# Patient Record
Sex: Female | Born: 1995
Health system: Southern US, Community
[De-identification: ages and names within clinical notes are randomized; demographics above are authoritative.]

## PROBLEM LIST (undated history)

## (undated) DIAGNOSIS — R946 Abnormal results of thyroid function studies: Secondary | ICD-10-CM

## (undated) DIAGNOSIS — S060X9A Concussion with loss of consciousness of unspecified duration, initial encounter: Secondary | ICD-10-CM

## (undated) DIAGNOSIS — R7989 Other specified abnormal findings of blood chemistry: Secondary | ICD-10-CM

## (undated) DIAGNOSIS — L52 Erythema nodosum: Secondary | ICD-10-CM

## (undated) DIAGNOSIS — E669 Obesity, unspecified: Secondary | ICD-10-CM

## (undated) DIAGNOSIS — S060XAA Concussion with loss of consciousness status unknown, initial encounter: Secondary | ICD-10-CM

## (undated) DIAGNOSIS — E049 Nontoxic goiter, unspecified: Secondary | ICD-10-CM

## (undated) DIAGNOSIS — I10 Essential (primary) hypertension: Secondary | ICD-10-CM

## (undated) DIAGNOSIS — E739 Lactose intolerance, unspecified: Secondary | ICD-10-CM

## (undated) DIAGNOSIS — E119 Type 2 diabetes mellitus without complications: Secondary | ICD-10-CM

## (undated) DIAGNOSIS — E782 Mixed hyperlipidemia: Secondary | ICD-10-CM

## (undated) DIAGNOSIS — J309 Allergic rhinitis, unspecified: Secondary | ICD-10-CM

## (undated) DIAGNOSIS — L309 Dermatitis, unspecified: Secondary | ICD-10-CM

## (undated) DIAGNOSIS — R945 Abnormal results of liver function studies: Secondary | ICD-10-CM

## (undated) DIAGNOSIS — L83 Acanthosis nigricans: Secondary | ICD-10-CM

## (undated) DIAGNOSIS — K219 Gastro-esophageal reflux disease without esophagitis: Secondary | ICD-10-CM

## (undated) DIAGNOSIS — L4050 Arthropathic psoriasis, unspecified: Secondary | ICD-10-CM

## (undated) DIAGNOSIS — R1013 Epigastric pain: Secondary | ICD-10-CM

## (undated) DIAGNOSIS — D509 Iron deficiency anemia, unspecified: Secondary | ICD-10-CM

## (undated) DIAGNOSIS — R7303 Prediabetes: Secondary | ICD-10-CM

## (undated) HISTORY — DX: Erythema nodosum: L52

## (undated) HISTORY — DX: Acanthosis nigricans: L83

## (undated) HISTORY — PX: TONSILLECTOMY AND ADENOIDECTOMY: SHX28

## (undated) HISTORY — DX: Other specified abnormal findings of blood chemistry: R79.89

## (undated) HISTORY — PX: WISDOM TOOTH EXTRACTION: SHX21

## (undated) HISTORY — DX: Essential (primary) hypertension: I10

## (undated) HISTORY — DX: Arthropathic psoriasis, unspecified: L40.50

## (undated) HISTORY — DX: Allergic rhinitis, unspecified: J30.9

## (undated) HISTORY — DX: Lactose intolerance, unspecified: E73.9

## (undated) HISTORY — PX: OTHER SURGICAL HISTORY: SHX169

## (undated) HISTORY — DX: Gastro-esophageal reflux disease without esophagitis: K21.9

## (undated) HISTORY — DX: Iron deficiency anemia, unspecified: D50.9

## (undated) HISTORY — DX: Type 2 diabetes mellitus without complications: E11.9

## (undated) HISTORY — DX: Dermatitis, unspecified: L30.9

## (undated) HISTORY — DX: Prediabetes: R73.03

## (undated) HISTORY — DX: Concussion with loss of consciousness of unspecified duration, initial encounter: S06.0X9A

## (undated) HISTORY — PX: ANKLE SURGERY: SHX546

## (undated) HISTORY — DX: Obesity, unspecified: E66.9

## (undated) HISTORY — DX: Mixed hyperlipidemia: E78.2

## (undated) HISTORY — DX: Epigastric pain: R10.13

## (undated) HISTORY — DX: Abnormal results of liver function studies: R94.5

## (undated) HISTORY — DX: Abnormal results of thyroid function studies: R94.6

## (undated) HISTORY — PX: TYMPANOSTOMY TUBE PLACEMENT: SHX32

## (undated) HISTORY — DX: Nontoxic goiter, unspecified: E04.9

## (undated) HISTORY — DX: Concussion with loss of consciousness status unknown, initial encounter: S06.0XAA

---
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
## 2014-01-05 ENCOUNTER — Encounter: Payer: Self-pay | Admitting: Obstetrics and Gynecology

## 2014-01-05 ENCOUNTER — Ambulatory Visit (INDEPENDENT_AMBULATORY_CARE_PROVIDER_SITE_OTHER): Payer: 59 | Admitting: Obstetrics and Gynecology

## 2014-01-05 VITALS — BP 110/70 | HR 80 | Resp 12 | Ht 67.0 in | Wt 234.6 lb

## 2014-01-05 DIAGNOSIS — Z01419 Encounter for gynecological examination (general) (routine) without abnormal findings: Secondary | ICD-10-CM

## 2014-01-05 DIAGNOSIS — Z0000 Encounter for general adult medical examination without abnormal findings: Secondary | ICD-10-CM

## 2014-01-05 LAB — HEMOGLOBIN, FINGERSTICK: HEMOGLOBIN, FINGERSTICK: 11.6 g/dL — AB (ref 12.0–16.0)

## 2014-01-05 LAB — POCT URINALYSIS DIPSTICK
UROBILINOGEN UA: NEGATIVE
pH, UA: 5

## 2014-01-05 NOTE — Progress Notes (Signed)
18 y.o. G0P0000 SingleCaucasianF here for annual exam.   Has menses last 10 - 12 days but this is better than what they were in the past.  Can be only 5 days.  No real cramping.  Does not check IUD strings.  Overall, cycles are improved.   Declines STD testing.   Finished first year of college.  [place] Learning Spanish.   Having a flare of her erythema nodosum for the last 8 - 9 months.  Seeing Rheumatologist who does not think it is related to her Skyla IUD.  Using methotrexate, tramadol, and Zantac.   Patient's last menstrual period was 01/05/2014.          Sexually active: No.  Never.  The current method of family planning is IUD.   Skyla IUD placed on 03/10/13. Exercising: Yes.    indoor soccer. Smoker:  no  Health Maintenance: Colonoscopy:  2014- Normal f/u in 5 years  TDaP:  Within 10 years Screening Labs: yes , Hb today: 11.6 , Urine today: Blood ++ (Menses), Trace Protein, Leuks ++ - Denies urgency or pain.    reports that she has never smoked. She has never used smokeless tobacco. She reports that she does not drink alcohol or use illicit drugs.  Past Medical History  Diagnosis Date  . Gastroesophageal reflux   . Obesity   . Acanthosis   . Abnormal thyroid function test   . Combined hyperlipidemia   . Dyspepsia   . Prediabetes   . Hypertension   . Abnormal liver function tests   . Asthma   . Diabetes mellitus without complication   . Erythema nodosum   . Iron deficiency anemia   . Allergic rhinitis   . Diabetes mellitus   . Eczema   . Goiter     hypothyroidsm--hashimoto's  . Lactose intolerance     Past Surgical History  Procedure Laterality Date  . Tympanostomy tube placement    . Tonsillectomy and adenoidectomy    . Sinuses    . Ankle surgery Right     --bone spur  . Wisdom tooth extraction      Current Outpatient Prescriptions  Medication Sig Dispense Refill  . acetaminophen (TYLENOL) 500 MG tablet Take  1,000 mg by mouth every 4 (four) hours as needed for fever (alternating with ibuprofen).    Marland Kitchen. albuterol (PROVENTIL) (2.5 MG/3ML) 0.083% nebulizer solution Take 2.5 mg by nebulization every 6 (six) hours as needed for wheezing.    . B Complex-C (B-COMPLEX WITH VITAMIN C) tablet Take 1 tablet by mouth 2 (two) times daily.    . bethanechol (URECHOLINE) 5 MG tablet TAKE 1 TABLET THREE TIMES A DAY 270 tablet 3  . calcium-vitamin D (CALCIUM 500/D) 500-200 MG-UNIT per tablet 1 tablet.    . cholecalciferol (VITAMIN D) 1000 UNITS tablet Take 1,000 Units by mouth daily.    . clobetasol ointment (TEMOVATE) 0.05 % Apply to body lesions twice daily for 2 wks, then daily as needed for 2 wks, then stop. Can repeat as needed for flares. Not to face.    . desonide (DESOWEN) 0.05 % cream Apply topically 2 times daily. As needed for dry skin    . EPIPEN 2-PAK 0.3 MG/0.3ML DEVI Inject 0.3 mg into the muscle once as needed (for severe allergic reation).     Marland Kitchen. esomeprazole (NEXIUM) 40 MG capsule Take 1 capsule (40 mg total) by mouth daily before breakfast. 90 capsule 3  . ezetimibe (ZETIA)  10 MG tablet 10 mg.    . ibuprofen (ADVIL,MOTRIN) 200 MG tablet Take 600 mg by mouth every 6 (six) hours as needed for pain or fever (alternating with tlyenol).     . Iron Polysacch Cmplx-B12-FA 150-0.025-1 MG CAPS 2 capsules.    . iron polysaccharides (NIFEREX) 150 MG capsule Take 150 mg by mouth 2 (two) times daily.    Marland Kitchen levalbuterol (XOPENEX HFA) 45 MCG/ACT inhaler Inhale 1-2 puffs into the lungs every 6 (six) hours as needed for wheezing or shortness of breath.    . levocetirizine (XYZAL) 5 MG tablet Take 5 mg by mouth every evening.     . metFORMIN (GLUCOPHAGE) 1000 MG tablet TAKE 1 TABLET TWICE A DAY WITH MEALS 180 tablet 1  . Multiple Vitamin (MULTI-VITAMINS) TABS 1 tablet.    . naproxen (NAPROSYN) 500 MG tablet Take one tablet by mouth twice daily with food.    Marland Kitchen NOVOFINE 32G X 6 MM MISC USE 1 NEEDLE DAILY 1 each 2  .  Olopatadine HCl (PATADAY) 0.2 % SOLN 1 drop.    Marland Kitchen SYNTHROID 125 MCG tablet TAKE 1 TABLET DAILY 90 tablet 1  . triamcinolone ointment (KENALOG) 0.1 % Apply to affected areas on the body daily to twice daily as needed for flares. Not to face.    Geoffry Paradise 150 MG injection Inject 150 mg into the skin every 14 (fourteen) days.     Marland Kitchen ZETIA 10 MG tablet TAKE 1 TABLET DAILY 90 tablet 5  . [DISCONTINUED] bethanechol (URECHOLINE) 5 MG tablet Take 1 tablet (5 mg total) by mouth 3 (three) times daily. 270 tablet 3   No current facility-administered medications for this visit.    Family History  Problem Relation Age of Onset  . Cholelithiasis Maternal Grandmother   . Diabetes Maternal Grandmother   . Cervical cancer Maternal Grandmother   . Stroke Maternal Grandmother   . Diabetes Mother   . Hyperlipidemia Father   . Hypertension Father   . Diabetes Father   . Colon cancer Father   . Cancer Maternal Grandfather   . Diabetes Maternal Grandfather   . Stroke Maternal Grandfather   . Thyroid disease Neg Hx   . Diabetes Paternal Grandmother   . Diabetes Paternal Grandfather     ROS:  Pertinent items are noted in HPI.  Otherwise, a comprehensive ROS was negative.  Exam:   BP 110/70 mmHg  Pulse 80  Resp 12  Ht 5\' 7"  (1.702 m)  Wt 234 lb 9.6 oz (106.414 kg)  BMI 36.73 kg/m2  LMP 01/05/2014     Height: 5\' 7"  (170.2 cm)  Ht Readings from Last 3 Encounters:  01/05/14 5\' 7"  (1.702 m) (86 %*, Z = 1.07)  04/21/13 5\' 6"  (1.676 m) (75 %*, Z = 0.69)  11/09/12 5' 6.3" (1.684 m) (79 %*, Z = 0.82)   * Growth percentiles are based on CDC 2-20 Years data.    General appearance: alert, cooperative and appears stated age Head: Normocephalic, without obvious abnormality, atraumatic Neck: no adenopathy, supple, symmetrical, trachea midline and thyroid normal to inspection and palpation Lungs: clear to auscultation bilaterally Breasts: normal appearance, no masses or tenderness, Inspection negative, No  nipple retraction or dimpling, No nipple discharge or bleeding, No axillary or supraclavicular adenopathy Heart: regular rate and rhythm Abdomen: soft, non-tender; bowel sounds normal; no masses,  no organomegaly Extremities: extremities with erythematous slightly tender raised 1 - 2 cm lumps. Skin: Skin color, texture, turgor normal. No rashes or lesions  Lymph nodes: Cervical, supraclavicular, and axillary nodes normal. No abnormal inguinal nodes palpated Neurologic: Grossly normal   Pelvic: External genitalia:  no lesions              Urethra:  normal appearing urethra with no masses, tenderness or lesions              Bartholins and Skenes: normal                 Vagina: normal appearing vagina with normal color and discharge, no lesions              Cervix: no lesions and Menstrual flow noted.  IUD strings seen.              Pap taken: No. Bimanual Exam:  Uterus:  normal size, contour, position, consistency, mobility, non-tender              Adnexa: normal adnexa and no mass, fullness, tenderness               Rectovaginal: Confirms               Anus:  normal sphincter tone, no lesions  Assessment   Well Woman with normal exam Skyla IUD patient. Bleeding profile improved overall.  Mild stable anemia.  Erythema nodosum.   Plan     pap smear at age 89 years old. return annually or prn  An After Visit Summary was printed and given to the patient.

## 2014-01-05 NOTE — Patient Instructions (Signed)

## 2014-01-09 ENCOUNTER — Ambulatory Visit (INDEPENDENT_AMBULATORY_CARE_PROVIDER_SITE_OTHER): Payer: 59 | Admitting: Pediatric Endocrinology

## 2014-01-09 ENCOUNTER — Encounter: Payer: Self-pay | Admitting: "Endocrinology

## 2014-01-09 VITALS — BP 114/75 | HR 94 | Wt 236.0 lb

## 2014-01-09 DIAGNOSIS — E034 Atrophy of thyroid (acquired): Secondary | ICD-10-CM

## 2014-01-09 DIAGNOSIS — R946 Abnormal results of thyroid function studies: Secondary | ICD-10-CM

## 2014-01-09 DIAGNOSIS — E782 Mixed hyperlipidemia: Secondary | ICD-10-CM

## 2014-01-09 DIAGNOSIS — E038 Other specified hypothyroidism: Secondary | ICD-10-CM

## 2014-01-09 DIAGNOSIS — E669 Obesity, unspecified: Secondary | ICD-10-CM

## 2014-01-09 LAB — T4, FREE: FREE T4: 1.22 ng/dL (ref 0.80–1.80)

## 2014-01-09 LAB — COMPREHENSIVE METABOLIC PANEL
ALK PHOS: 39 U/L (ref 39–117)
ALT: 19 U/L (ref 0–35)
AST: 15 U/L (ref 0–37)
Albumin: 3.8 g/dL (ref 3.5–5.2)
BILIRUBIN TOTAL: 0.4 mg/dL (ref 0.2–1.1)
BUN: 7 mg/dL (ref 6–23)
CO2: 22 mEq/L (ref 19–32)
CREATININE: 0.57 mg/dL (ref 0.50–1.10)
Calcium: 8.9 mg/dL (ref 8.4–10.5)
Chloride: 105 mEq/L (ref 96–112)
Glucose, Bld: 76 mg/dL (ref 70–99)
Potassium: 4 mEq/L (ref 3.5–5.3)
Sodium: 138 mEq/L (ref 135–145)
Total Protein: 6.7 g/dL (ref 6.0–8.3)

## 2014-01-09 LAB — LIPID PANEL
CHOLESTEROL: 149 mg/dL (ref 0–200)
HDL: 63 mg/dL (ref >39)
LDL Cholesterol: 64 mg/dL (ref 0–99)
Total CHOL/HDL Ratio: 2.4 Ratio
Triglycerides: 109 mg/dL (ref <150)
VLDL: 22 mg/dL (ref 0–40)

## 2014-01-09 LAB — POCT GLYCOSYLATED HEMOGLOBIN (HGB A1C): HEMOGLOBIN A1C: 5.7

## 2014-01-09 LAB — GLUCOSE, POCT (MANUAL RESULT ENTRY): POC GLUCOSE: 101 mg/dL — AB (ref 70–99)

## 2014-01-09 LAB — TSH: TSH: 0.765 u[IU]/mL (ref 0.350–4.500)

## 2014-01-09 NOTE — Progress Notes (Signed)
Subjective:  Subjective Patient Name: Valerie Marquez Date of Birth: 1995/08/28  MRN: [id number]  Valerie Marquez  presents to the office today for follow-up evaluation and management of her obesity, acanthosis, goiter, hypothyroidism, thyroiditis, hyperlipidemia, dyspepsia, prediabetes, hypertension, and abnormal liver function tests. Now with erythema nodosum, chronic anemia, and menorrhagia.    HISTORY OF PRESENT ILLNESS:   Valerie Marquez is a 18 y.o. Caucasian female   Zaiyah was accompanied by her self  1. Valerie Marquez was first referred to our clinic on 05/15/04 by her pediatrician, Dr. Aggie Hacker, for evaluation and management of obesity, hyperlipidemia, GERD, abnormal TSH, hunger pains, and asthma.  [age] was 23-1/19 years of age.  She was a preterm infant from a pregnancy complicated by gestational diabetes. She had acanthosis apparent prior to her first visit.  She began to develop  breasts at age 19. She also developed axillary hair and pubic hair at the same time. Family history was positive for obesity and type 2 diabetes on both sides of the family. There was no known thyroid disease. Lab data from 04/23/04 showed a hemoglobin A1c of 5.4%. CMP was normal. TSH was 3.53. T4 was 6.8. Cholesterol was 199. Laboratory data on 05/16/04 showed a TSH of 3.996, free T4 1.14, and free T3 of 4.0. FSH was 0.7. Her LH was less than 0.1. Her testosterone was 37.96. Her estradiol was less than 10. DHEAS was 56. Her androstenedione was 0.4. Her fasting glucose was 91. Her fasting insulin was 38, which placed her in the hyperinsulinemia range. Her 24-hour urine free cortisol value was 7.7 (normal less than 37). She did not have any evidence for Cushing's disease. It appeared that she had the misfortune to inherit genetics for obesity and type 2 diabetes from both sides of her family. Part of the cause of her weight gain has been episodic treatment with steroids for her asthma. In late 2006, when her TSH values remain  elevated, we started her on Synthroid, 25 mcg per day. Since then, we have gradually increased her Synthroid dose to 125 mcg per day. Because her dyspepsia was a major stimulus for weight gain, we started treating her with Prevacid back in 2007. She's continued on her metformin as well. In 2009 we started her on Zetia, 10 mg per day, which resulted improvement in her cholesterol, LDL, and triglycerides per.     2. The patient's last [facility] visit was on 05/10/13. In the interim, she has had a recurrence of her erythema nodosum. She was on 70 mg of Prednisone per day for 6 weeks. She tapered 10 mg every 4 days to off. Her last dose was about 10 days ago. She has been restarted on Methotrexate. She is at [employer] and loving school. She is playing intramural sports.   She is drinking mostly Lipton Diet Green Tea. She is also drinking a lot of water. She is eating off schedule at school. She feels that her steroids and methotrexate have been reducing her appetite.    She has continued on Metformin 1000 mg twice daily and Vit D 1000 IU/day.   3. Pertinent Review of Systems:  Constitutional: The patient feels "so fantastic <sic>". The patient seems frustrated.  Eyes: Vision seems to be good. There are no recognized eye problems. Wears glasses Neck: The patient has no complaints of anterior neck swelling, soreness, tenderness, pressure, discomfort, or difficulty swallowing.   Heart: Heart rate increases with exercise or other physical activity. The patient has no complaints of palpitations, irregular  heart beats, chest pain, or chest pressure.   Gastrointestinal: Bowel movents seem normal. The patient has no complaints of excessive hunger, acid reflux, upset stomach, stomach aches or pains, diarrhea, or constipation. Usually uses [place]ia and apples in the evenings- was on spring break last week and did not use her usual regimen- now feeling bloated and backed up.  Legs: Muscle mass and strength seem  normal. There are no complaints of numbness, tingling, burning, or pain. No edema is noted.  Feet: There are no obvious foot problems. There are no complaints of numbness, tingling, burning, or pain. No edema is noted. Having joint pain. Out of Tramadol.  Neurologic: There are no recognized problems with muscle movement and strength, sensation, or coordination. GYN/GU: periods regular- IUD in place  PAST MEDICAL, FAMILY, AND SOCIAL HISTORY  Past Medical History  Diagnosis Date  . Gastroesophageal reflux   . Obesity   . Acanthosis   . Abnormal thyroid function test   . Combined hyperlipidemia   . Dyspepsia   . Prediabetes   . Hypertension   . Abnormal liver function tests   . Asthma   . Diabetes mellitus without complication   . Erythema nodosum   . Iron deficiency anemia   . Allergic rhinitis   . Diabetes mellitus   . Eczema   . Goiter     hypothyroidsm--hashimoto's  . Lactose intolerance     Family History  Problem Relation Age of Onset  . Cholelithiasis Maternal Grandmother   . Diabetes Maternal Grandmother   . Cervical cancer Maternal Grandmother   . Stroke Maternal Grandmother   . Diabetes Mother   . Hyperlipidemia Father   . Hypertension Father   . Diabetes Father   . Colon cancer Father   . Cancer Maternal Grandfather   . Diabetes Maternal Grandfather   . Stroke Maternal Grandfather   . Thyroid disease Neg Hx   . Diabetes Paternal Grandmother   . Diabetes Paternal Grandfather     Current outpatient prescriptions: B Complex-C (B-COMPLEX WITH VITAMIN C) tablet, Take 1 tablet by mouth 2 (two) times daily., Disp: , Rfl: ;  calcium-vitamin D (CALCIUM 500/D) 500-200 MG-UNIT per tablet, 1 tablet., Disp: , Rfl: ;  cholecalciferol (VITAMIN D) 1000 UNITS tablet, Take 1,000 Units by mouth daily., Disp: , Rfl: ;  desonide (DESOWEN) 0.05 % cream, Apply topically 2 times daily. As needed for dry skin, Disp: , Rfl:  ezetimibe (ZETIA) 10 MG tablet, 10 mg., Disp: , Rfl: ;   levocetirizine (XYZAL) 5 MG tablet, Take 5 mg by mouth every evening. , Disp: , Rfl: ;  metFORMIN (GLUCOPHAGE) 1000 MG tablet, TAKE 1 TABLET TWICE A DAY WITH MEALS, Disp: 180 tablet, Rfl: 1;  Methotrexate Sodium, PF, 50 MG/2ML SOLN, , Disp: , Rfl: 0;  Multiple Vitamin (MULTI-VITAMINS) TABS, 1 tablet., Disp: , Rfl: ;  ranitidine (ZANTAC) 150 MG tablet, Take 150 mg by mouth once., Disp: , Rfl: 0 acetaminophen (TYLENOL) 500 MG tablet, Take 1,000 mg by mouth every 4 (four) hours as needed for fever (alternating with ibuprofen)., Disp: , Rfl: ;  albuterol (PROVENTIL) (2.5 MG/3ML) 0.083% nebulizer solution, Take 2.5 mg by nebulization every 6 (six) hours as needed for wheezing., Disp: , Rfl:  clobetasol ointment (TEMOVATE) 0.05 %, Apply to body lesions twice daily for 2 wks, then daily as needed for 2 wks, then stop. Can repeat as needed for flares. Not to face., Disp: , Rfl: ;  EPIPEN 2-PAK 0.3 MG/0.3ML DEVI, Inject 0.3 mg into  the muscle once as needed (for severe allergic reation). , Disp: , Rfl: ;  esomeprazole (NEXIUM) 40 MG capsule, Take 1 capsule (40 mg total) by mouth daily before breakfast., Disp: 90 capsule, Rfl: 3 ibuprofen (ADVIL,MOTRIN) 200 MG tablet, Take 600 mg by mouth every 6 (six) hours as needed for pain or fever (alternating with tlyenol). , Disp: , Rfl: ;  iron polysaccharides (NIFEREX) 150 MG capsule, Take 150 mg by mouth 2 (two) times daily., Disp: , Rfl: ;  levalbuterol (XOPENEX HFA) 45 MCG/ACT inhaler, Inhale 1-2 puffs into the lungs every 6 (six) hours as needed for wheezing or shortness of breath., Disp: , Rfl:  SYNTHROID 125 MCG tablet, TAKE 1 TABLET DAILY, Disp: 90 tablet, Rfl: 1;  traMADol (ULTRAM) 50 MG tablet, , Disp: , Rfl: 0;  [DISCONTINUED] bethanechol (URECHOLINE) 5 MG tablet, Take 1 tablet (5 mg total) by mouth 3 (three) times daily., Disp: 270 tablet, Rfl: 3  Allergies as of 01/09/2014 - Review Complete 01/05/2014  Allergen Reaction Noted  . Chicken allergy Itching 05/09/2012   . Corn-containing products  05/09/2012  . Eggs or egg-derived products  05/09/2012  . Fish allergy  05/09/2012  . Peanut-containing drug products  05/09/2012  . Amoxicillin Nausea And Vomiting and Rash 06/30/2010  . Other Rash 04/21/2013     reports that she has never smoked. She has never used smokeless tobacco. She reports that she does not drink alcohol or use illicit drugs. Pediatric History  Patient Guardian Status  . Mother:  Marcellina MillinHanlon,Dana   Other Topics Concern  . Not on file   Social History Narrative   In 12th grade [facility] [employer]. Back injury. Umpire for softball.  Lives with mom, 2 sibs. Dad deceased 1/14.    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   Primary Care Provider: Beverely LowSUMNER,BRIAN A, MD  ROS: There are no other significant problems involving Dwanna's other body systems.    Objective:  Objective Vital Signs:  BP 114/75 mmHg  Pulse 94  Wt 236 lb (107.049 kg)  LMP 01/05/2014    Ht Readings from Last 3 Encounters:  01/05/14 5\' 7"  (1.702 m) (86 %*, Z = 1.07)  04/21/13 5\' 6"  (1.676 m) (75 %*, Z = 0.69)  11/09/12 5' 6.3" (1.684 m) (79 %*, Z = 0.82)   * Growth percentiles are based on CDC 2-20 Years data.   Wt Readings from Last 3 Encounters:  01/09/14 236 lb (107.049 kg) (99 %*, Z = 2.36)  01/05/14 234 lb 9.6 oz (106.414 kg) (99 %*, Z = 2.35)  05/10/13 223 lb (101.152 kg) (99 %*, Z = 2.23)   * Growth percentiles are based on CDC 2-20 Years data.   HC Readings from Last 3 Encounters:  [facility]   There is no height on file to calculate BSA. No height on file for this encounter. 99%ile (Z=2.36) based on CDC 2-20 Years weight-for-age data using vitals from 01/09/2014.    PHYSICAL EXAM:  Constitutional: The patient appears healthy and well nourished. The patient's height and weight are advanced for age.  Head: The head is normocephalic. Face: The face  appears swollen and full Eyes: The eyes appear to be normally formed and spaced. Gaze is conjugate. There is no obvious arcus or proptosis. Moisture appears normal. Ears: The ears are normally placed and appear externally normal.  Mouth: The oropharynx and tongue appear normal. Dentition appears to be normal for  age. Oral moisture is normal. Neck: The neck appears to be visibly normal. The thyroid gland is 20+ grams in size. The consistency of the thyroid gland is normal. The thyroid gland is not tender to palpation. Lungs: The lungs are clear to auscultation. Air movement is good. Heart: Heart rate and rhythm are regular. Heart sounds S1 and S2 are normal. I did not appreciate any pathologic cardiac murmurs. Abdomen: The abdomen appears to be large in size for the patient's age. Bowel sounds are normal. There is no obvious hepatomegaly, splenomegaly, or other mass effect. +stretch marks Arms: Muscle size and bulk are normal for age. Hands: There is no obvious tremor. Phalangeal and metacarpophalangeal joints are normal. Dry cracked skin on hands. Peeling around finger tips.  Legs: Muscles appear normal for age. No edema is present. Large erythematous nodules Feet: Feet are normally formed. Dorsalis pedal pulses are normal. Neurologic: Strength is normal for age in both the upper and lower extremities. Muscle tone is normal. Sensation to touch is normal in both the legs and feet.   GYN/GU: normal external  LAB DATA:  Results for orders placed or performed in visit on 01/09/14  POCT Glucose (CBG)  Result Value Ref Range   POC Glucose 101 (A) 70 - 99 mg/dl  POCT HgB [id number]  Result Value Ref Range   Hemoglobin A1C 5.7        Assessment and Plan:  Assessment ASSESSMENT:  1. Hypothyroidism- clinically euthyroid. Large goiter. Labs today 2. Type 2 diabetes- treated with diet, exercise, and metformin. A1C increase likely secondary to prednisone.  3. Hyperlipidemia- on Zetia.  4. ERythema  Nodosum- in flare  PLAN:  1. Diagnostic: A1C as above. TFTs, lipids, cmp, and vit D labs ordered.  2. Therapeutic: Continue synthroid, zetia, and metformin 3. Patient education: Reviewed changes since last visit including recurrance of erythema nodosum and recent steroids. Discussed possible longer taper if feeling fatigued in the next week. Discussed lifestyle choices. Overall she is doing well other than rheum complications.  4. Follow-up: Return in about 4 months (around 05/11/2014).      Cammie Sickle, MD   LOS Level of Service: This visit lasted in excess of 25 minutes. More than 50% of the visit was devoted to counseling.

## 2014-01-09 NOTE — Patient Instructions (Signed)
Labs today or tomorrow after Rheum visit  Continue Metformin and Vit D.  Continue low calorie drinks  If you start to feel like you are super run down - you may need to do a slower end taper of your pred- start back at 10 mg x 4 days, then 5 mg x4 days then 2.5 mg x 4 days. Then every other day x 6 days. Then stop.

## 2014-01-10 LAB — VITAMIN D 25 HYDROXY (VIT D DEFICIENCY, FRACTURES): Vit D, 25-Hydroxy: 22 ng/mL — ABNORMAL LOW (ref 30–100)

## 2014-01-11 ENCOUNTER — Encounter: Payer: Self-pay | Admitting: *Deleted

## 2014-01-11 ENCOUNTER — Other Ambulatory Visit: Payer: Self-pay | Admitting: *Deleted

## 2014-01-11 DIAGNOSIS — R7989 Other specified abnormal findings of blood chemistry: Secondary | ICD-10-CM

## 2014-01-11 MED ORDER — ERGOCALCIFEROL 1.25 MG (50000 UT) PO CAPS
ORAL_CAPSULE | ORAL | Status: DC
Start: 2014-01-11 — End: 2015-01-16

## 2014-01-14 ENCOUNTER — Other Ambulatory Visit: Payer: Self-pay | Admitting: Pediatric Endocrinology

## 2014-01-23 ENCOUNTER — Telehealth: Payer: Self-pay | Admitting: Obstetrics and Gynecology

## 2014-01-23 NOTE — Telephone Encounter (Signed)
Patient's mom "Annabelle HarmanDana" is calling regarding recent statement. (DPR on file to talk with mom). Annabelle HarmanDana is unsure what the "uninsured" part means on the statement. Patient has insurance.

## 2014-01-29 NOTE — Telephone Encounter (Signed)
Returned Dan's call. I left a voicemail message for her to call back.

## 2014-03-18 ENCOUNTER — Other Ambulatory Visit: Payer: Self-pay | Admitting: Pediatric Endocrinology

## 2014-03-27 ENCOUNTER — Other Ambulatory Visit: Payer: Self-pay | Admitting: Pediatric Endocrinology

## 2014-05-14 ENCOUNTER — Ambulatory Visit: Payer: 59 | Admitting: Pediatric Endocrinology

## 2014-05-24 ENCOUNTER — Ambulatory Visit (INDEPENDENT_AMBULATORY_CARE_PROVIDER_SITE_OTHER): Payer: 59 | Admitting: Pediatrics

## 2014-05-24 ENCOUNTER — Encounter: Payer: Self-pay | Admitting: Pediatrics

## 2014-05-24 VITALS — BP 120/80 | HR 64 | Wt 222.0 lb

## 2014-05-24 DIAGNOSIS — L309 Dermatitis, unspecified: Secondary | ICD-10-CM

## 2014-05-24 DIAGNOSIS — E119 Type 2 diabetes mellitus without complications: Secondary | ICD-10-CM | POA: Diagnosis not present

## 2014-05-24 DIAGNOSIS — D509 Iron deficiency anemia, unspecified: Secondary | ICD-10-CM

## 2014-05-24 DIAGNOSIS — E559 Vitamin D deficiency, unspecified: Secondary | ICD-10-CM

## 2014-05-24 DIAGNOSIS — E669 Obesity, unspecified: Secondary | ICD-10-CM

## 2014-05-24 DIAGNOSIS — E038 Other specified hypothyroidism: Secondary | ICD-10-CM | POA: Diagnosis not present

## 2014-05-24 DIAGNOSIS — R1013 Epigastric pain: Secondary | ICD-10-CM | POA: Diagnosis not present

## 2014-05-24 DIAGNOSIS — I10 Essential (primary) hypertension: Secondary | ICD-10-CM

## 2014-05-24 DIAGNOSIS — E782 Mixed hyperlipidemia: Secondary | ICD-10-CM

## 2014-05-24 DIAGNOSIS — J4520 Mild intermittent asthma, uncomplicated: Secondary | ICD-10-CM

## 2014-05-24 LAB — GLUCOSE, POCT (MANUAL RESULT ENTRY): POC Glucose: 83 mg/dl (ref 70–99)

## 2014-05-24 LAB — POCT GLYCOSYLATED HEMOGLOBIN (HGB A1C): HEMOGLOBIN A1C: 5.4

## 2014-05-24 MED ORDER — LEVOCETIRIZINE DIHYDROCHLORIDE 5 MG PO TABS: 5.0000 mg | ORAL_TABLET | Freq: Every evening | ORAL | Status: DC

## 2014-05-24 MED ORDER — EZETIMIBE 10 MG PO TABS: 10.0000 mg | ORAL_TABLET | Freq: Every day | ORAL | Status: DC

## 2014-05-24 MED ORDER — PANTOPRAZOLE SODIUM 40 MG PO TBEC: 40.0000 mg | DELAYED_RELEASE_TABLET | Freq: Two times a day (BID) | ORAL | Status: DC

## 2014-05-24 MED ORDER — DESONIDE 0.05 % EX CREA: TOPICAL_CREAM | Freq: Two times a day (BID) | CUTANEOUS | Status: DC

## 2014-05-24 MED ORDER — SYNTHROID 125 MCG PO TABS
125.0000 ug | ORAL_TABLET | Freq: Every day | ORAL | Status: DC
Start: 2014-05-24 — End: 2015-01-15

## 2014-05-24 MED ORDER — METFORMIN HCL 1000 MG PO TABS: 1000.0000 mg | ORAL_TABLET | Freq: Two times a day (BID) | ORAL | Status: DC

## 2014-05-24 NOTE — Patient Instructions (Signed)
We refilled meds today.   We will call you with labs.

## 2014-05-24 NOTE — Progress Notes (Signed)
Subjective:  Subjective Marquez Name: Valerie Marquez Date of Birth: 28-Dec-1995  MRN: [id number]  Valerie Marquez  presents to Valerie office today for follow-up evaluation and management of Valerie Marquez obesity, acanthosis, goiter, hypothyroidism, thyroiditis, hyperlipidemia, dyspepsia, prediabetes, hypertension, and abnormal liver function tests. Now with erythema nodosum, chronic anemia, and menorrhagia.    HISTORY OF PRESENT ILLNESS:   Valerie Marquez is a 19 y.o. Caucasian female   Valerie Marquez was accompanied by Valerie Marquez self  1. Valerie Marquez was first referred to our clinic on 05/15/04 by Valerie Marquez pediatrician, Dr. Aggie , for evaluation and management of obesity, hyperlipidemia, GERD, abnormal TSH, hunger pains, and asthma.  Valerie Marquez was 11-1/20 years of age.  Valerie Marquez was a preterm infant from a pregnancy complicated by gestational diabetes. Valerie Marquez had acanthosis apparent prior to Valerie Marquez first visit.  Valerie Marquez began to develop  breasts at age 49. Valerie Marquez also developed axillary hair and pubic hair at Valerie same time. Family history was positive for obesity and type 2 diabetes on both sides of Valerie family. There was no known thyroid disease. Lab data from 04/23/04 showed a hemoglobin A1c of 5.4%. CMP was normal. TSH was 3.53. T4 was 6.8. Cholesterol was 199. Laboratory data on 05/16/04 showed a TSH of 3.996, free T4 1.14, and free T3 of 4.0. FSH was 0.7. Valerie Marquez LH was less than 0.1. Valerie Marquez testosterone was 37.96. Valerie Marquez estradiol was less than 10. DHEAS was 56. Valerie Marquez androstenedione was 0.4. Valerie Marquez fasting glucose was 91. Valerie Marquez fasting insulin was 38, which placed Valerie Marquez in Valerie hyperinsulinemia range. Valerie Marquez 24-hour urine free cortisol value was 7.7 (normal less than 37). Valerie Marquez did not have any evidence for Cushing's disease. It appeared that Valerie Marquez had Valerie misfortune to inherit genetics for obesity and type 2 diabetes from both sides of Valerie Marquez family. Part of Valerie cause of Valerie Marquez weight gain has been episodic treatment with steroids for Valerie Marquez asthma. In late 2006, when Valerie Marquez TSH values remain  elevated, we started Valerie Marquez on Synthroid, 25 mcg per day. Since then, we have gradually increased Valerie Marquez Synthroid dose to 125 mcg per day. Because Valerie Marquez dyspepsia was a major stimulus for weight gain, we started treating Valerie Marquez with Prevacid back in 2007. Valerie Marquez's continued on Valerie Marquez metformin as well. In 2009 we started Valerie Marquez on Zetia, 10 mg per day, which resulted improvement in Valerie Marquez cholesterol, LDL, and triglycerides per.     2. Valerie Marquez last [facility] visit was on 01/09/14. In Valerie interim, Valerie Marquez has been generally healthy. Valerie Marquez erythema nodosum has much improved. Valerie Marquez is off methotrexate and steroids. Just taking ultram PRN for joint pain. Valerie Marquez got a concussion 6 weeks ago and is seeing neurology and will need to take incompletes and finish work through Valerie summer. Is cycling daily with a group of guys at school. Valerie Marquez rides 5-6 miles a day. Plays pickup basketball a few nights a week. Valerie Marquez got a puppy (lab/pointer/dalmation mix). 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   3. Pertinent Review of Systems:  Constitutional: Valerie Marquez feels "tired but good". Valerie Marquez seems healthy and happy.  Eyes: Vision seems to be good. There are no recognized eye problems. Wears glasses. Neurologist has Valerie Marquez on amantidine and a sleep medication Valerie Marquez can't remember Valerie name of. Neck: Valerie Marquez has no complaints of anterior neck swelling, soreness, tenderness, pressure, discomfort, or difficulty swallowing.   Heart: Heart rate  increases with exercise or other physical activity. Valerie Marquez has no complaints of palpitations, irregular heart beats, chest pain, or chest pressure.   Gastrointestinal: Bowel movents seem normal. Valerie Marquez has no complaints of excessive hunger, acid reflux, upset stomach, stomach aches or pains, diarrhea, or constipation. Valerie Marquez gets full really about halfway through me  Legs: Muscle mass and  strength seem normal. There are no complaints of numbness, tingling, burning, or pain. No edema is noted.  Feet: There are no obvious foot problems. There are no complaints of numbness, tingling, burning, or pain. No edema is noted.  Neurologic: There are no recognized problems with muscle movement and strength, sensation, or coordination. GYN/GU: periods regular- IUD in place  PAST MEDICAL, FAMILY, AND SOCIAL HISTORY  Past Medical History  Diagnosis Date  . Gastroesophageal reflux   . Obesity   . Acanthosis   . Abnormal thyroid function test   . Combined hyperlipidemia   . Dyspepsia   . Prediabetes   . Hypertension   . Abnormal liver function tests   . Asthma   . Diabetes mellitus without complication   . Erythema nodosum   . Iron deficiency anemia   . Allergic rhinitis   . Diabetes mellitus   . Eczema   . Goiter     hypothyroidsm--hashimoto's  . Lactose intolerance     Family History  Problem Relation Age of Onset  . Cholelithiasis Maternal Grandmother   . Diabetes Maternal Grandmother   . Cervical cancer Maternal Grandmother   . Stroke Maternal Grandmother   . Diabetes Mother   . Hyperlipidemia Father   . Hypertension Father   . Diabetes Father   . Colon cancer Father   . Cancer Maternal Grandfather   . Diabetes Maternal Grandfather   . Stroke Maternal Grandfather   . Thyroid disease Neg Hx   . Diabetes Paternal Grandmother   . Diabetes Paternal Grandfather      Current outpatient prescriptions:  .  B Complex-C (B-COMPLEX WITH VITAMIN C) tablet, Take 1 tablet by mouth 2 (two) times daily., Disp: , Rfl:  .  calcium-vitamin D (CALCIUM 500/D) 500-200 MG-UNIT per tablet, 1 tablet., Disp: , Rfl:  .  cholecalciferol (VITAMIN D) 1000 UNITS tablet, Take 1,000 Units by mouth daily., Disp: , Rfl:  .  desonide (DESOWEN) 0.05 % cream, Apply topically 2 (two) times daily. Apply topically 2 times daily. As needed for dry skin, Disp: 60 g, Rfl: 6 .  ergocalciferol (VITAMIN  D2) 50000 UNITS capsule, Take 1 capsule once a week for 12 weeks, Disp: 12 capsule, Rfl: 2 .  ezetimibe (ZETIA) 10 MG tablet, Take 1 tablet (10 mg total) by mouth daily., Disp: 90 tablet, Rfl: 6 .  levocetirizine (XYZAL) 5 MG tablet, Take 1 tablet (5 mg total) by mouth every evening., Disp: 90 tablet, Rfl: 3 .  metFORMIN (GLUCOPHAGE) 1000 MG tablet, Take 1 tablet (1,000 mg total) by mouth 2 (two) times daily with a meal., Disp: 180 tablet, Rfl: 6 .  Multiple Vitamin (MULTI-VITAMINS) TABS, 1 tablet., Disp: , Rfl:  .  SYNTHROID 125 MCG tablet, Take 1 tablet (125 mcg total) by mouth daily., Disp: 90 tablet, Rfl: 3 .  traMADol (ULTRAM) 50 MG tablet, , Disp: , Rfl: 0 .  acetaminophen (TYLENOL) 500 MG tablet, Take 1,000 mg by mouth every 4 (four) hours as needed for fever (alternating with ibuprofen)., Disp: , Rfl:  .  albuterol (PROVENTIL) (2.5 MG/3ML) 0.083% nebulizer solution, Take 2.5 mg by nebulization every 6 (  six) hours as needed for wheezing., Disp: , Rfl:  .  EPIPEN 2-PAK 0.3 MG/0.3ML DEVI, Inject 0.3 mg into Valerie muscle once as needed (for severe allergic reation). , Disp: , Rfl:  .  ibuprofen (ADVIL,MOTRIN) 200 MG tablet, Take 600 mg by mouth every 6 (six) hours as needed for pain or fever (alternating with tlyenol). , Disp: , Rfl:  .  levalbuterol (XOPENEX HFA) 45 MCG/ACT inhaler, Inhale 1-2 puffs into Valerie lungs every 6 (six) hours as needed for wheezing or shortness of breath., Disp: , Rfl:  .  pantoprazole (PROTONIX) 40 MG tablet, Take 1 tablet (40 mg total) by mouth 2 (two) times daily., Disp: 60 tablet, Rfl: 6 .  [DISCONTINUED] bethanechol (URECHOLINE) 5 MG tablet, Take 1 tablet (5 mg total) by mouth 3 (three) times daily., Disp: 270 tablet, Rfl: 3  Allergies as of 05/24/2014 - Review Complete 05/24/2014  Allergen Reaction Noted  . Chicken allergy Itching 05/09/2012  . Corn-containing products  05/09/2012  . Eggs or egg-derived products  05/09/2012  . Fish allergy  05/09/2012  .  Peanut-containing drug products  05/09/2012  . Amoxicillin Nausea And Vomiting and Rash 06/30/2010  . Other Rash 04/21/2013     reports that Valerie Marquez has never smoked. Valerie Marquez has never used smokeless tobacco. Valerie Marquez reports that Valerie Marquez does not drink alcohol or use illicit drugs. Pediatric History  Marquez Guardian Status  . Mother:  Marcellina MillinHanlon,Dana   Other Topics Concern  . Not on file   Social History Narrative   In 12th grade at [employer]. Back injury. Umpire for softball.  Lives with mom, 2 sibs. Dad deceased 1/14.    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   Primary Care Provider: Beverely LowSUMNER,BRIAN A, MD  ROS: There are no other significant problems involving Zailah's other body systems.    Objective:  Objective Vital Signs:  BP 120/80 mmHg  Pulse 64  Wt 222 lb (100.699 kg)    Ht Readings from Last 3 Encounters:  01/05/14 5\' 7"  (1.702 m) (86 %*, Z = 1.07)  04/21/13 5\' 6"  (1.676 m) (75 %*, Z = 0.69)  11/09/12 5' 6.3" (1.684 m) (79 %*, Z = 0.82)   * Growth percentiles are based on CDC 2-20 Years data.   Wt Readings from Last 3 Encounters:  05/24/14 222 lb (100.699 kg) (99 %*, Z = 2.23)  01/09/14 236 lb (107.049 kg) (99 %*, Z = 2.36)  01/05/14 234 lb 9.6 oz (106.414 kg) (99 %*, Z = 2.35)   * Growth percentiles are based on CDC 2-20 Years data.   HC Readings from Last 3 Encounters:  [facility]   There is no height on file to calculate BSA. No height on file for this encounter. 99%ile (Z=2.23) based on CDC 2-20 Years weight-for-age data using vitals from 05/24/2014.    PHYSICAL EXAM:  Constitutional: Valerie Marquez appears healthy and well nourished. Valerie Marquez height and weight are advanced for age.  Head: Valerie head is normocephalic. Face: Valerie face appears swollen and full Eyes: Valerie eyes appear to be normally formed and spaced. Gaze is conjugate. There is no obvious arcus or proptosis.  Moisture appears normal. Ears: Valerie ears are normally placed and appear externally normal.  Mouth: Valerie oropharynx and tongue appear normal. Dentition appears to be normal for age. Oral moisture is normal. Neck: Valerie neck appears to be visibly normal. Valerie thyroid gland is normal in size.  Valerie consistency of Valerie thyroid gland is normal. Valerie thyroid gland is not tender to palpation. Lungs: Valerie lungs are clear to auscultation. Air movement is good. Heart: Heart rate and rhythm are regular. Heart sounds S1 and S2 are normal. I did not appreciate any pathologic cardiac murmurs. Abdomen: Valerie abdomen appears to be large in size for Valerie Marquez age. Bowel sounds are normal. There is no obvious hepatomegaly, splenomegaly, or other mass effect. +stretch marks Arms: Muscle size and bulk are normal for age. Hands: There is no obvious tremor. Phalangeal and metacarpophalangeal joints are normal. Dry cracked skin on hands. Peeling around finger tips.  Legs: Muscles appear normal for age. No edema is present. Large erythematous nodules Feet: Feet are normally formed. Dorsalis pedal pulses are normal. Neurologic: Strength is normal for age in both Valerie upper and lower extremities. Muscle tone is normal. Sensation to touch is normal in both Valerie legs and feet.   GYN/GU: deferred  LAB DATA:  Results for orders placed or performed in visit on 05/24/14  POCT Glucose (CBG)  Result Value Ref Range   POC Glucose 83 70 - 99 mg/dl  POCT HgB Z6X  Result Value Ref Range   Hemoglobin A1C 5.4        Assessment and Plan:  Assessment ASSESSMENT:  1. Hypothyroidism- clinically euthyroid. Goiter improved. Labs today.  2. Type 2 diabetes- A1C down to 5.4% today. Continues on metformin and continues to increase exercise.  3. Hyperlipidemia- on Zetia.  4. Erythema Nodosum- in remission.   PLAN:  1. Diagnostic: A1C as above. TFTs, cbc and vit d ordered today.   2. Therapeutic: Continue synthroid, zetia, and metformin.  Restart PPI- pantoprazole 40 mg BID today for concerns with nausea and gastric discomfort. Provided refills on all meds including xyzal and desonide cream today.  3. Marquez education: Marquez doing really well other than continued concussion sx. Valerie Marquez has had 13 concussion in Valerie Marquez lifetime but followed by neuro and is improving. From a rheum/endo perspective this is Valerie best Valerie Marquez has been in a long time. Valerie Marquez has lost 14 pounds since last visit. Feeling very encouraged by this. Valerie Marquez will continue to be active throughout Valerie summer at camp.  4. Follow-up: 4 months with Dr. Thomos Lemons T, FNP-C   LOS Level of Service: This visit lasted in excess of 25 minutes. More than 50% of Valerie visit was devoted to counseling.

## 2014-06-23 ENCOUNTER — Other Ambulatory Visit: Payer: Self-pay | Admitting: Pediatric Endocrinology

## 2014-09-24 ENCOUNTER — Ambulatory Visit: Payer: 59 | Admitting: Pediatric Endocrinology

## 2014-11-13 ENCOUNTER — Emergency Department (HOSPITAL_COMMUNITY): Payer: 59

## 2014-11-13 ENCOUNTER — Inpatient Hospital Stay (HOSPITAL_COMMUNITY)
Admission: EM | Admit: 2014-11-13 | Discharge: 2014-11-16 | DRG: 546 | Disposition: A | Discharge status: Home / Self Care | Payer: 59 | Attending: Internal Medicine | Admitting: Internal Medicine

## 2014-11-13 ENCOUNTER — Encounter (HOSPITAL_COMMUNITY): Payer: Self-pay | Admitting: Emergency Medicine

## 2014-11-13 DIAGNOSIS — Z91018 Allergy to other foods: Secondary | ICD-10-CM | POA: Diagnosis not present

## 2014-11-13 DIAGNOSIS — L52 Erythema nodosum: Secondary | ICD-10-CM | POA: Diagnosis present

## 2014-11-13 DIAGNOSIS — L309 Dermatitis, unspecified: Secondary | ICD-10-CM | POA: Diagnosis present

## 2014-11-13 DIAGNOSIS — Z91010 Allergy to peanuts: Secondary | ICD-10-CM

## 2014-11-13 DIAGNOSIS — Z79899 Other long term (current) drug therapy: Secondary | ICD-10-CM | POA: Diagnosis not present

## 2014-11-13 DIAGNOSIS — I776 Arteritis, unspecified: Secondary | ICD-10-CM | POA: Diagnosis present

## 2014-11-13 DIAGNOSIS — R7303 Prediabetes: Secondary | ICD-10-CM | POA: Diagnosis present

## 2014-11-13 DIAGNOSIS — R7302 Impaired glucose tolerance (oral): Secondary | ICD-10-CM | POA: Diagnosis present

## 2014-11-13 DIAGNOSIS — E038 Other specified hypothyroidism: Secondary | ICD-10-CM | POA: Diagnosis present

## 2014-11-13 DIAGNOSIS — M329 Systemic lupus erythematosus, unspecified: Principal | ICD-10-CM | POA: Diagnosis present

## 2014-11-13 DIAGNOSIS — N390 Urinary tract infection, site not specified: Secondary | ICD-10-CM | POA: Diagnosis present

## 2014-11-13 DIAGNOSIS — E669 Obesity, unspecified: Secondary | ICD-10-CM | POA: Diagnosis present

## 2014-11-13 DIAGNOSIS — G3184 Mild cognitive impairment, so stated: Secondary | ICD-10-CM | POA: Diagnosis present

## 2014-11-13 DIAGNOSIS — R0789 Other chest pain: Secondary | ICD-10-CM | POA: Diagnosis present

## 2014-11-13 DIAGNOSIS — J45909 Unspecified asthma, uncomplicated: Secondary | ICD-10-CM | POA: Diagnosis present

## 2014-11-13 DIAGNOSIS — E739 Lactose intolerance, unspecified: Secondary | ICD-10-CM | POA: Diagnosis present

## 2014-11-13 DIAGNOSIS — E782 Mixed hyperlipidemia: Secondary | ICD-10-CM | POA: Diagnosis present

## 2014-11-13 DIAGNOSIS — Z6831 Body mass index (BMI) 31.0-31.9, adult: Secondary | ICD-10-CM

## 2014-11-13 DIAGNOSIS — T380X5A Adverse effect of glucocorticoids and synthetic analogues, initial encounter: Secondary | ICD-10-CM | POA: Diagnosis present

## 2014-11-13 DIAGNOSIS — D696 Thrombocytopenia, unspecified: Secondary | ICD-10-CM | POA: Diagnosis present

## 2014-11-13 DIAGNOSIS — E049 Nontoxic goiter, unspecified: Secondary | ICD-10-CM | POA: Diagnosis present

## 2014-11-13 DIAGNOSIS — Z881 Allergy status to other antibiotic agents status: Secondary | ICD-10-CM | POA: Diagnosis not present

## 2014-11-13 DIAGNOSIS — E063 Autoimmune thyroiditis: Secondary | ICD-10-CM | POA: Diagnosis present

## 2014-11-13 DIAGNOSIS — Z7984 Long term (current) use of oral hypoglycemic drugs: Secondary | ICD-10-CM | POA: Diagnosis not present

## 2014-11-13 DIAGNOSIS — R3129 Other microscopic hematuria: Secondary | ICD-10-CM | POA: Diagnosis present

## 2014-11-13 DIAGNOSIS — E039 Hypothyroidism, unspecified: Secondary | ICD-10-CM | POA: Diagnosis present

## 2014-11-13 DIAGNOSIS — K219 Gastro-esophageal reflux disease without esophagitis: Secondary | ICD-10-CM | POA: Diagnosis present

## 2014-11-13 DIAGNOSIS — R072 Precordial pain: Secondary | ICD-10-CM

## 2014-11-13 DIAGNOSIS — D72829 Elevated white blood cell count, unspecified: Secondary | ICD-10-CM | POA: Diagnosis not present

## 2014-11-13 DIAGNOSIS — M1990 Unspecified osteoarthritis, unspecified site: Secondary | ICD-10-CM | POA: Diagnosis present

## 2014-11-13 DIAGNOSIS — D649 Anemia, unspecified: Secondary | ICD-10-CM | POA: Diagnosis present

## 2014-11-13 DIAGNOSIS — Z794 Long term (current) use of insulin: Secondary | ICD-10-CM

## 2014-11-13 LAB — DIFFERENTIAL
Basophils Absolute: 0 10*3/uL (ref 0.0–0.1)
Basophils Relative: 0 %
EOS ABS: 0 10*3/uL (ref 0.0–0.7)
EOS PCT: 0 %
Lymphocytes Relative: 17 %
Lymphs Abs: 1.2 10*3/uL (ref 0.7–4.0)
MONO ABS: 0.7 10*3/uL (ref 0.1–1.0)
Monocytes Relative: 10 %
NEUTROS PCT: 73 %
Neutro Abs: 5.4 10*3/uL (ref 1.7–7.7)

## 2014-11-13 LAB — COMPREHENSIVE METABOLIC PANEL
ALT: 24 U/L (ref 14–54)
AST: 17 U/L (ref 15–41)
Albumin: 3.3 g/dL — ABNORMAL LOW (ref 3.5–5.0)
Alkaline Phosphatase: 43 U/L (ref 38–126)
Anion gap: 9 (ref 5–15)
BILIRUBIN TOTAL: 0.5 mg/dL (ref 0.3–1.2)
BUN: 9 mg/dL (ref 6–20)
CALCIUM: 8.9 mg/dL (ref 8.9–10.3)
CHLORIDE: 109 mmol/L (ref 101–111)
CO2: 21 mmol/L — ABNORMAL LOW (ref 22–32)
CREATININE: 0.58 mg/dL (ref 0.44–1.00)
Glucose, Bld: 123 mg/dL — ABNORMAL HIGH (ref 65–99)
Potassium: 3.9 mmol/L (ref 3.5–5.1)
Sodium: 139 mmol/L (ref 135–145)
TOTAL PROTEIN: 7.2 g/dL (ref 6.5–8.1)

## 2014-11-13 LAB — CBC
HCT: 37.7 % (ref 36.0–46.0)
Hemoglobin: 12.5 g/dL (ref 12.0–15.0)
MCH: 27.4 pg (ref 26.0–34.0)
MCHC: 33.2 g/dL (ref 30.0–36.0)
MCV: 82.5 fL (ref 78.0–100.0)
PLATELETS: 352 10*3/uL (ref 150–400)
RBC: 4.57 MIL/uL (ref 3.87–5.11)
RDW: 14.1 % (ref 11.5–15.5)
WBC: 7.5 10*3/uL (ref 4.0–10.5)

## 2014-11-13 LAB — URINALYSIS, ROUTINE W REFLEX MICROSCOPIC
Glucose, UA: 100 mg/dL — AB
KETONES UR: NEGATIVE mg/dL
LEUKOCYTES UA: NEGATIVE
NITRITE: NEGATIVE
PROTEIN: 100 mg/dL — AB
Specific Gravity, Urine: >1.03 — ABNORMAL HIGH (ref 1.005–1.030)
UROBILINOGEN UA: 1 mg/dL (ref 0.0–1.0)
pH: 5.5 (ref 5.0–8.0)

## 2014-11-13 LAB — URINE MICROSCOPIC-ADD ON

## 2014-11-13 LAB — LACTIC ACID, PLASMA
LACTIC ACID, VENOUS: 1 mmol/L (ref 0.5–2.0)
Lactic Acid, Venous: 0.9 mmol/L (ref 0.5–2.0)

## 2014-11-13 LAB — I-STAT BETA HCG BLOOD, ED (MC, WL, AP ONLY): I-stat hCG, quantitative: <5 m[IU]/mL (ref <5)

## 2014-11-13 LAB — C-REACTIVE PROTEIN: CRP: 11.3 mg/dL — AB (ref <1.0)

## 2014-11-13 LAB — D-DIMER, QUANTITATIVE: D-Dimer, Quant: 1.2 ug/mL-FEU — ABNORMAL HIGH (ref 0.00–0.48)

## 2014-11-13 LAB — GLUCOSE, CAPILLARY: GLUCOSE-CAPILLARY: 115 mg/dL — AB (ref 65–99)

## 2014-11-13 LAB — SEDIMENTATION RATE: Sed Rate: 60 mm/hr — ABNORMAL HIGH (ref 0–22)

## 2014-11-13 MED ORDER — VITAMIN D 1000 UNITS PO TABS
1000.0000 [IU] | ORAL_TABLET | Freq: Every day | ORAL | Status: DC
  Administered 2014-11-14 – 2014-11-16 (×3): 1000 [IU] via ORAL
  Filled 2014-11-13 (×3): qty 1

## 2014-11-13 MED ORDER — ONDANSETRON HCL 4 MG/2ML IJ SOLN
4.0000 mg | Freq: Four times a day (QID) | INTRAMUSCULAR | Status: DC | PRN
Start: 2014-11-13 — End: 2014-11-16

## 2014-11-13 MED ORDER — INSULIN ASPART 100 UNIT/ML ~~LOC~~ SOLN: 0.0000 [IU] | Freq: Every day | SUBCUTANEOUS | Status: DC

## 2014-11-13 MED ORDER — LORATADINE 10 MG PO TABS
10.0000 mg | ORAL_TABLET | Freq: Every evening | ORAL | Status: DC
  Filled 2014-11-13: qty 1

## 2014-11-13 MED ORDER — HYDROCODONE-ACETAMINOPHEN 5-325 MG PO TABS
1.0000 | ORAL_TABLET | ORAL | Status: AC | PRN
  Administered 2014-11-13 – 2014-11-14 (×3): 1 via ORAL
  Filled 2014-11-13 (×3): qty 1

## 2014-11-13 MED ORDER — LEVALBUTEROL HCL 0.63 MG/3ML IN NEBU: 0.6300 mg | INHALATION_SOLUTION | Freq: Four times a day (QID) | RESPIRATORY_TRACT | Status: DC | PRN

## 2014-11-13 MED ORDER — INFLUENZA VAC SPLIT QUAD 0.5 ML IM SUSY
0.5000 mL | PREFILLED_SYRINGE | INTRAMUSCULAR | Status: DC
  Filled 2014-11-13: qty 0.5

## 2014-11-13 MED ORDER — SODIUM CHLORIDE 0.9 % IV SOLN
INTRAVENOUS | Status: DC
  Administered 2014-11-13: 21:00:00 via INTRAVENOUS
  Filled 2014-11-13: qty 1000

## 2014-11-13 MED ORDER — B COMPLEX-C PO TABS
1.0000 | ORAL_TABLET | Freq: Two times a day (BID) | ORAL | Status: DC
  Administered 2014-11-13 – 2014-11-16 (×6): 1 via ORAL
  Filled 2014-11-13 (×9): qty 1

## 2014-11-13 MED ORDER — SODIUM CHLORIDE 0.9 % IV SOLN
1000.0000 mL | Freq: Once | INTRAVENOUS | Status: AC
  Administered 2014-11-13: 1000 mL via INTRAVENOUS

## 2014-11-13 MED ORDER — METHYLPREDNISOLONE SODIUM SUCC 125 MG IJ SOLR
125.0000 mg | Freq: Once | INTRAMUSCULAR | Status: AC
  Administered 2014-11-13: 125 mg via INTRAVENOUS
  Filled 2014-11-13: qty 2

## 2014-11-13 MED ORDER — SODIUM CHLORIDE 0.9 % IV SOLN
1000.0000 mL | INTRAVENOUS | Status: DC
  Administered 2014-11-13: 1000 mL via INTRAVENOUS

## 2014-11-13 MED ORDER — ACETAMINOPHEN 650 MG RE SUPP: 650.0000 mg | Freq: Four times a day (QID) | RECTAL | Status: DC | PRN

## 2014-11-13 MED ORDER — ONDANSETRON HCL 4 MG PO TABS: 4.0000 mg | ORAL_TABLET | Freq: Four times a day (QID) | ORAL | Status: DC | PRN

## 2014-11-13 MED ORDER — EZETIMIBE 10 MG PO TABS
10.0000 mg | ORAL_TABLET | Freq: Every day | ORAL | Status: DC
  Administered 2014-11-14 – 2014-11-16 (×3): 10 mg via ORAL
  Filled 2014-11-13 (×3): qty 1

## 2014-11-13 MED ORDER — PNEUMOCOCCAL VAC POLYVALENT 25 MCG/0.5ML IJ INJ
0.5000 mL | INJECTION | INTRAMUSCULAR | Status: DC
  Filled 2014-11-13: qty 0.5

## 2014-11-13 MED ORDER — ENOXAPARIN SODIUM 40 MG/0.4ML ~~LOC~~ SOLN
40.0000 mg | SUBCUTANEOUS | Status: DC
Start: 2014-11-13 — End: 2014-11-16
  Administered 2014-11-13 – 2014-11-15 (×3): 40 mg via SUBCUTANEOUS
  Filled 2014-11-13 (×3): qty 0.4

## 2014-11-13 MED ORDER — METHYLPREDNISOLONE SODIUM SUCC 125 MG IJ SOLR
125.0000 mg | Freq: Four times a day (QID) | INTRAMUSCULAR | Status: DC
  Administered 2014-11-13 – 2014-11-14 (×3): 125 mg via INTRAVENOUS
  Filled 2014-11-13 (×3): qty 2

## 2014-11-13 MED ORDER — LEVOTHYROXINE SODIUM 125 MCG PO TABS
125.0000 ug | ORAL_TABLET | Freq: Every day | ORAL | Status: DC
  Administered 2014-11-14 – 2014-11-16 (×3): 125 ug via ORAL
  Filled 2014-11-13 (×3): qty 1

## 2014-11-13 MED ORDER — ACETAMINOPHEN 325 MG PO TABS
650.0000 mg | ORAL_TABLET | Freq: Four times a day (QID) | ORAL | Status: DC | PRN
  Filled 2014-11-13 (×2): qty 2

## 2014-11-13 MED ORDER — INSULIN ASPART 100 UNIT/ML ~~LOC~~ SOLN
0.0000 [IU] | Freq: Three times a day (TID) | SUBCUTANEOUS | Status: DC
  Administered 2014-11-14: 1 [IU] via SUBCUTANEOUS
  Administered 2014-11-14: 2 [IU] via SUBCUTANEOUS
  Administered 2014-11-14: 1 [IU] via SUBCUTANEOUS
  Administered 2014-11-15 (×3): 2 [IU] via SUBCUTANEOUS

## 2014-11-13 MED ORDER — PANTOPRAZOLE SODIUM 40 MG PO TBEC
40.0000 mg | DELAYED_RELEASE_TABLET | Freq: Two times a day (BID) | ORAL | Status: DC
  Administered 2014-11-13 – 2014-11-16 (×6): 40 mg via ORAL
  Filled 2014-11-13 (×6): qty 1

## 2014-11-13 NOTE — ED Provider Notes (Signed)
CSN: [id number]     Arrival date & time 11/13/14  1441 History   First MD Initiated Contact with Patient 11/13/14 1614     Chief Complaint  Patient presents with  . Lupus  . Urinary Tract Infection     (Consider location/radiation/quality/duration/timing/severity/associated sxs/prior Treatment) Patient is a 19 y.o. female presenting with urinary tract infection. The history is provided by the patient.  Urinary Tract Infection She has a history of lupus and states that for the last 5 days she has been having fevers as high as 104.5, generalized arthralgias, slight cough and some difficulty urinating. She had seen her PCP who could not find a reason. She was tested for influenza and did not have influenza. She saw her rheumatologist yesterday who did inflammatory markers and concluded that she had an exacerbation of her lupus. She has broken out in a rash on her legs of which is typical of erythema nodosum which she has had before and has been associated with flares of her lupus. Fevers tend to calm only at night and do not respond well to antipyretics. She has had a dry cough. Urinalysis had her rheumatologist's office was suspicious for infection and was sent for culture with results pending. She was given an injection of steroid yesterday and started on prednisone 30 mg today but she does not feel like she is getting any improvement. In the past, flares like this have had to be treated as an inpatient. [facility] [place]. Of note, she had also been started on azithromycin 2 days ago. This was to treat her erythema nodosum.  Past Medical History  Diagnosis Date  . Gastroesophageal reflux   . Obesity   . Acanthosis   . Abnormal thyroid function test   . Combined hyperlipidemia   . Dyspepsia   . Prediabetes   . Hypertension   . Abnormal liver function tests   . Asthma   . Diabetes mellitus without complication (HCC)   . Erythema  nodosum   . Iron deficiency anemia   . Allergic rhinitis   . Diabetes mellitus (HCC)   . Eczema   . Goiter     hypothyroidsm--hashimoto's  . Lactose intolerance    Past Surgical History  Procedure Laterality Date  . Tympanostomy tube placement    . Tonsillectomy and adenoidectomy    . Sinuses    . Ankle surgery Right     --bone spur  . Wisdom tooth extraction     Family History  Problem Relation Age of Onset  . Cholelithiasis Maternal Grandmother   . Diabetes Maternal Grandmother   . Cervical cancer Maternal Grandmother   . Stroke Maternal Grandmother   . Diabetes Mother   . Hyperlipidemia Father   . Hypertension Father   . Diabetes Father   . Colon cancer Father   . Cancer Maternal Grandfather   . Diabetes Maternal Grandfather   . Stroke Maternal Grandfather   . Thyroid disease Neg Hx   . Diabetes Paternal Grandmother   . Diabetes Paternal Grandfather    Social History  Substance Use Topics  . Smoking status: Never Smoker   . Smokeless tobacco: Never Used  . Alcohol Use: No   OB History    Gravida Para Term Preterm AB TAB SAB Ectopic Multiple Living   0 0 0 0 0 0 0 0 0 0      Review of Systems  All other systems reviewed and are negative.  Allergies  Chicken allergy; Corn-containing products; Eggs or egg-derived products; Fish allergy; Peanut-containing drug products; Amoxicillin; and Other  Home Medications   Prior to Admission medications   Medication Sig Start Date End Date Taking? Authorizing Provider  acetaminophen (TYLENOL) 500 MG tablet Take 1,000 mg by mouth every 4 (four) hours as needed for fever (alternating with ibuprofen).    Historical Provider, MD  albuterol (PROVENTIL) (2.5 MG/3ML) 0.083% nebulizer solution Take 2.5 mg by nebulization every 6 (six) hours as needed for wheezing.    Historical Provider, MD  Amantadine HCl 100 MG tablet  04/23/14   Historical Provider, MD  B Complex-C (B-COMPLEX WITH VITAMIN C) tablet Take 1 tablet by  mouth 2 (two) times daily.    Historical Provider, MD  calcium-vitamin D (CALCIUM 500/D) 500-200 MG-UNIT per tablet 1 tablet.    Historical Provider, MD  cholecalciferol (VITAMIN D) 1000 UNITS tablet Take 1,000 Units by mouth daily.    Historical Provider, MD  desonide (DESOWEN) 0.05 % cream Apply topically 2 (two) times daily. Apply topically 2 times daily. As needed for dry skin 05/24/14   Verneda Skill, FNP  EPIPEN 2-PAK 0.3 MG/0.3ML DEVI Inject 0.3 mg into the muscle once as needed (for severe allergic reation).  08/30/10   Historical Provider, MD  ergocalciferol (VITAMIN D2) 50000 UNITS capsule Take 1 capsule once a week for 12 weeks 01/11/14   Dessa Phi, MD  ezetimibe (ZETIA) 10 MG tablet Take 1 tablet (10 mg total) by mouth daily. 05/24/14   Verneda Skill, FNP  ibuprofen (ADVIL,MOTRIN) 200 MG tablet Take 600 mg by mouth every 6 (six) hours as needed for pain or fever (alternating with tlyenol).     Historical Provider, MD  levalbuterol [facility] HFA) 45 MCG/ACT inhaler Inhale 1-2 puffs into the lungs every 6 (six) hours as needed for wheezing or shortness of breath.    Historical Provider, MD  levocetirizine (XYZAL) 5 MG tablet Take 1 tablet (5 mg total) by mouth every evening. 05/24/14   Verneda Skill, FNP  metFORMIN (GLUCOPHAGE) 1000 MG tablet Take 1 tablet (1,000 mg total) by mouth 2 (two) times daily with a meal. 05/24/14   Verneda Skill, FNP  Multiple Vitamin (MULTI-VITAMINS) TABS 1 tablet.    Historical Provider, MD  NOVOFINE 32G X 6 MM MISC USE 1 NEEDLE DAILY 06/26/14   Dessa Phi, MD  pantoprazole (PROTONIX) 40 MG tablet Take 1 tablet (40 mg total) by mouth 2 (two) times daily. 05/24/14   Verneda Skill, FNP  SYNTHROID 125 MCG tablet Take 1 tablet (125 mcg total) by mouth daily. 05/24/14   Verneda Skill, FNP  traMADol Janean Sark) 50 MG tablet  12/20/13   Historical Provider, MD   BP 139/72 mmHg  Pulse 94  Temp(Src) 98.7 F (37.1 C) (Oral)  Resp 18  SpO2  98% Physical Exam  Nursing note and vitals reviewed.  19 year old female, resting comfortably and in no acute distress. Vital signs are normal. Oxygen saturation is 98%, which is normal. Head is normocephalic and atraumatic. PERRLA, EOMI. Oropharynx is clear. Neck is nontender and supple without adenopathy or JVD. Back is nontender in the midline. There is moderate bilateral CVA tenderness. Lungs are clear without rales, wheezes, or rhonchi. Chest is nontender. Heart has regular rate and rhythm without murmur. Abdomen is soft, flat, nontender without masses or hepatosplenomegaly and peristalsis is normoactive. Extremities have no cyanosis or edema, full range of motion is present. Skin is warm and dry.  Rash is present on legs with ecchymotic appearing areas approximately 2-4 cm in diameter. These are tender and typical of erythema nodosum. Neurologic: Mental status is normal, cranial nerves are intact, there are no motor or sensory deficits.  ED Course  Procedures (including critical care time) Labs Review Results for orders placed or performed during the hospital encounter of 11/13/14  Comprehensive metabolic panel  Result Value Ref Range   Sodium 139 135 - 145 mmol/L   Potassium 3.9 3.5 - 5.1 mmol/L   Chloride 109 101 - 111 mmol/L   CO2 21 (L) 22 - 32 mmol/L   Glucose, Bld 123 (H) 65 - 99 mg/dL   BUN 9 6 - 20 mg/dL   Creatinine, Ser 1.61 0.44 - 1.00 mg/dL   Calcium 8.9 8.9 - 09.6 mg/dL   Total Protein 7.2 6.5 - 8.1 g/dL   Albumin 3.3 (L) 3.5 - 5.0 g/dL   AST 17 15 - 41 U/L   ALT 24 14 - 54 U/L   Alkaline Phosphatase 43 38 - 126 U/L   Total Bilirubin 0.5 0.3 - 1.2 mg/dL   GFR calc non Af Amer >60 >60 mL/min   GFR calc Af Amer >60 >60 mL/min   Anion gap 9 5 - 15  CBC  Result Value Ref Range   WBC 7.5 4.0 - 10.5 K/uL   RBC 4.57 3.87 - 5.11 MIL/uL   Hemoglobin 12.5 12.0 - 15.0 g/dL   HCT 04.5 40.9 - 81.1 %   MCV 82.5 78.0 - 100.0 fL   MCH 27.4 26.0 - 34.0 pg   MCHC 33.2  30.0 - 36.0 g/dL   RDW 91.4 78.2 - 95.6 %   Platelets 352 150 - 400 K/uL  Urinalysis, Routine w reflex microscopic (not at [facility])  Result Value Ref Range   Color, Urine AMBER (A) YELLOW   APPearance TURBID (A) CLEAR   Specific Gravity, Urine >1.030 (H) 1.005 - 1.030   pH 5.5 5.0 - 8.0   Glucose, UA 100 (A) NEGATIVE mg/dL   Hgb urine dipstick MODERATE (A) NEGATIVE   Bilirubin Urine SMALL (A) NEGATIVE   Ketones, ur NEGATIVE NEGATIVE mg/dL   Protein, ur 213 (A) NEGATIVE mg/dL   Urobilinogen, UA 1.0 0.0 - 1.0 mg/dL   Nitrite NEGATIVE NEGATIVE   Leukocytes, UA NEGATIVE NEGATIVE  Differential  Result Value Ref Range   Neutrophils Relative % 73 %   Neutro Abs 5.4 1.7 - 7.7 K/uL   Lymphocytes Relative 17 %   Lymphs Abs 1.2 0.7 - 4.0 K/uL   Monocytes Relative 10 %   Monocytes Absolute 0.7 0.1 - 1.0 K/uL   Eosinophils Relative 0 %   Eosinophils Absolute 0.0 0.0 - 0.7 K/uL   Basophils Relative 0 %   Basophils Absolute 0.0 0.0 - 0.1 K/uL  Urine microscopic-add on  Result Value Ref Range   Squamous Epithelial / LPF RARE RARE   WBC, UA 0-2 <3 WBC/hpf   RBC / HPF 11-20 <3 RBC/hpf   Bacteria, UA RARE RARE   Crystals CA OXALATE CRYSTALS (A) NEGATIVE  I-Stat beta hCG blood, ED ([facility], [facility], [facility] only)  Result Value Ref Range   I-stat hCG, quantitative <5.0 <5 mIU/mL   Comment 3           Imaging Review Dg Chest 2 View  11/13/2014  CLINICAL DATA:  Fevers and pain, history of lupus EXAM: CHEST - 2 VIEW COMPARISON:  05/26/2013 FINDINGS: Cardiac shadow is stable. The lungs are clear  bilaterally. No focal infiltrate or sizable effusion is seen. No bony abnormality is noted. IMPRESSION: No active disease. Electronically Signed   By: Alcide Clever M.D.   On: 11/13/2014 17:18   I have personally reviewed and evaluated these images and lab results as part of my medical decision-making.  MDM   Final diagnoses:  Exacerbation of systemic lupus (HCC)  Erythema nodosum  Microscopic hematuria     Exacerbation of lupus which has not responded to initial management with steroids as an outpatient. She will need to be admitted for high-dose steroids. Old records were reviewed and she does have several hospitalizations at [facility] for exacerbations of lupus. And erythema nodosum.  Urinalysis shows hematuria without evidence of infection. This is concerning for possible lupus nephritis. Chest x-ray was also obtained showing no evidence of pneumonia. Case is discussed with Dr. Arbutus Leas of [facility] who agrees to come to evaluate the patient.    Dione Booze, MD 11/13/14 [phone]

## 2014-11-13 NOTE — ED Notes (Signed)
Patient's Mom states, "We do not know if this is medication induced or just a flare up because patient was given her second [place] last Tuesday."

## 2014-11-13 NOTE — H&P (Signed)
History and Physical  Valerie KanskySamantha M Marquez UJW:[id number] DOB: July 11, 1995 DOA: 11/13/2014   PCP: Beverely LowSUMNER,BRIAN A, MD  Referring Physician: ED/ Dr. Preston FleetingGlick  Chief Complaint: Arthralgias, worsening leg pain and erythema nodosum/  HPI:  19 year old female with a history of systemic lupus erythematosus, erythema nodosum, eczema, Hashimoto's thyroiditis, prediabetes, mild cognitive impairment secondary to concussions, hyperlipidemia presents with one-week history of fevers and increasing erythema nodosum with associated pain on her bilateral legs. The patient saw her rheumatologist (Dr Nickola MajorHawkes) on 11/12/2014 at which time, the patient was felt to have a flareup of her lupus and erythema nodosum. The patient was given an intramuscular injection of steroids and she started oral prednisone on 11/13/2014 with intention to taper. Blood work was obtained in the office and showed worsening inflammatory markers suggestive of her vasculitis flareup. In conjunction with her worsening pain and fevers, the patient was told to come to the emergency department. The patient has been having fevers at nighttime to 103.47F for the past week. During his fevers, the patient's mother states that she is occasionally confused, but when she is afebrile her mentation comes back to baseline. The patient has also been complaining of some substernal chest discomfort with some shortness of breath which she states that she usually gets with her lupus exacerbations. In the past week she has noted increasing erythema nodosum lesions on her bilateral lower extremities without any frank synovitis. She complains of headaches primarily when she has to fevers but resolves when her fevers improved. She denies any coughing, hemoptysis, dysuria, hematuria, hematochezia, melena.  In the emergency department, the patient was afebrile and hemodynamically stable. Oxygen saturation was 100% on room air. BMP and CBC were essentially unremarkable except  for some thrombocytopenia with platelets of 123,000. Chest x-ray was negative. Urinalysis did not show any pyuria but did show some microscopic hematuria.  Assessment/Plan: Lupus exacerbation -try to obtain records from Dr. Doren CustardHawks's office on the morning of 11/14/2014  -Check C3, C4, CH 50  -Check sedimentation rate, CRP  -Start Solu-Medrol 125 mg IV every 6 hours  Fever  -Likely due to lupus exacerbation  -Blood cultures 2 sets  -Chest x-ray negative for consolidation  -Urinalysis without any pyuria  -will not start antibodies at this time as the patient is hemodynamically stable  -Start intravenous fluids  -Check lactic acid  Atypical chest pain  -Likely due to her lupus exacerbation  -EKG  -Reproducible with examination  -Check d-dimer in the setting of vasculitis and shortness of breath  Hypothyroidism secondary to Hashimoto's thyroiditis  -Continue Synthroid  Prediabetes/impaired glucose tolerance  -Anticipate worsening CBG on steroids  -NovoLog sliding scale  -Hemoglobin A1c  -Discontinue metformin  GERD -Continue PPI Mild cognitive impairment -Secondary to multiple concussions -Mental status at baseline per mother Hyperlipidemia -Continue Zetia  Past Medical History  Diagnosis Date  . Gastroesophageal reflux   . Obesity   . Acanthosis   . Abnormal thyroid function test   . Combined hyperlipidemia   . Dyspepsia   . Prediabetes   . Hypertension   . Abnormal liver function tests   . Asthma   . Diabetes mellitus without complication (HCC)   . Erythema nodosum   . Iron deficiency anemia   . Allergic rhinitis   . Diabetes mellitus (HCC)   . Eczema   . Goiter     hypothyroidsm--hashimoto's  . Lactose intolerance    Past Surgical History  Procedure Laterality Date  . Tympanostomy tube placement    .  Tonsillectomy and adenoidectomy    . Sinuses    . Ankle surgery Right     --bone spur  . Wisdom tooth extraction     Social History:  reports that she has  never smoked. She has never used smokeless tobacco. She reports that she does not drink alcohol or use illicit drugs.   Family History  Problem Relation Age of Onset  . Cholelithiasis Maternal Grandmother   . Diabetes Maternal Grandmother   . Cervical cancer Maternal Grandmother   . Stroke Maternal Grandmother   . Diabetes Mother   . Hyperlipidemia Father   . Hypertension Father   . Diabetes Father   . Colon cancer Father   . Cancer Maternal Grandfather   . Diabetes Maternal Grandfather   . Stroke Maternal Grandfather   . Thyroid disease Neg Hx   . Diabetes Paternal Grandmother   . Diabetes Paternal Grandfather      Allergies  Allergen Reactions  . Chicken Allergy Itching  . Corn-Containing Products   . Eggs Or Egg-Derived Products   . Fish Allergy   . Peanut-Containing Drug Products   . Amoxicillin Nausea And Vomiting and Rash    No reaction listed  . Other Rash    Corn, Peanuts, Chicken, [place], Peas, Eggs & Fish      Prior to Admission medications   Medication Sig Start Date End Date Taking? Authorizing Provider  acetaminophen (TYLENOL) 500 MG tablet Take 1,000 mg by mouth every 4 (four) hours as needed for fever (alternating with ibuprofen).    Historical Provider, MD  albuterol (PROVENTIL) (2.5 MG/3ML) 0.083% nebulizer solution Take 2.5 mg by nebulization every 6 (six) hours as needed for wheezing.    Historical Provider, MD  Amantadine HCl 100 MG tablet  04/23/14   Historical Provider, MD  B Complex-C (B-COMPLEX WITH VITAMIN C) tablet Take 1 tablet by mouth 2 (two) times daily.    Historical Provider, MD  calcium-vitamin D (CALCIUM 500/D) 500-200 MG-UNIT per tablet 1 tablet.    Historical Provider, MD  cholecalciferol (VITAMIN D) 1000 UNITS tablet Take 1,000 Units by mouth daily.    Historical Provider, MD  desonide (DESOWEN) 0.05 % cream Apply topically 2 (two) times daily. Apply topically 2 times daily. As needed for dry skin 05/24/14   Verneda Skill, FNP    EPIPEN 2-PAK 0.3 MG/0.3ML DEVI Inject 0.3 mg into the muscle once as needed (for severe allergic reation).  08/30/10   Historical Provider, MD  ergocalciferol (VITAMIN D2) 50000 UNITS capsule Take 1 capsule once a week for 12 weeks 01/11/14   Dessa Phi, MD  ezetimibe (ZETIA) 10 MG tablet Take 1 tablet (10 mg total) by mouth daily. 05/24/14   Verneda Skill, FNP  ibuprofen (ADVIL,MOTRIN) 200 MG tablet Take 600 mg by mouth every 6 (six) hours as needed for pain or fever (alternating with tlyenol).     Historical Provider, MD  levalbuterol [facility] HFA) 45 MCG/ACT inhaler Inhale 1-2 puffs into the lungs every 6 (six) hours as needed for wheezing or shortness of breath.    Historical Provider, MD  levocetirizine (XYZAL) 5 MG tablet Take 1 tablet (5 mg total) by mouth every evening. 05/24/14   Verneda Skill, FNP  metFORMIN (GLUCOPHAGE) 1000 MG tablet Take 1 tablet (1,000 mg total) by mouth 2 (two) times daily with a meal. 05/24/14   Verneda Skill, FNP  Multiple Vitamin (MULTI-VITAMINS) TABS 1 tablet.    Historical Provider, MD  NOVOFINE 32G X 6  MM MISC USE 1 NEEDLE DAILY 06/26/14   Dessa Phi, MD  pantoprazole (PROTONIX) 40 MG tablet Take 1 tablet (40 mg total) by mouth 2 (two) times daily. 05/24/14   Verneda Skill, FNP  SYNTHROID 125 MCG tablet Take 1 tablet (125 mcg total) by mouth daily. 05/24/14   Verneda Skill, FNP  traMADol Janean Sark) 50 MG tablet  12/20/13   Historical Provider, MD    Review of Systems:  Constitutional:  No weight loss, night sweats, Fevers, chills, fatigue.  Head&Eyes: No headache.  No vision loss.  No eye pain or scotoma ENT:  No Difficulty swallowing,Tooth/dental problems,Sore throat,  No ear ache, post nasal drip,  Cardio-vascular:  No chest pain, Orthopnea, PND, swelling in lower extremities,  dizziness, palpitations  GI:  No  abdominal pain,  vomiting, diarrhea, loss of appetite, hematochezia, melena, heartburn, indigestion, Resp:  No cough. No  coughing up of blood .No wheezing.No chest wall deformity  Skin:  no rash or lesions.  GU:  no dysuria, change in color of urine, no urgency or frequency. No flank pain.  Musculoskeletal:  No joint pain or swelling. No decreased range of motion. No back pain.  Psych:  No change in mood or affect. No depression or anxiety. Neurologic:  no dysesthesia, no focal weakness, no vision loss. No syncope  Physical Exam: Filed Vitals:   11/13/14 1447 11/13/14 1630 11/13/14 1645  BP: 139/72 106/68 102/63  Pulse: 94 87 90  Temp: 98.7 F (37.1 C)    TempSrc: Oral    Resp: SpO2: 98% 100% 92%   General:  A&O x 3, NAD, nontoxic, pleasant/cooperative Head/Eye: No conjunctival hemorrhage, no icterus, [place]/AT, No nystagmus ENT:  No icterus,  No thrush, good dentition, no pharyngeal exudate Neck:  No masses, no lymphadenpathy, no bruits; no meningismus  CV:  RRR, no rub, no gallop, no S3 Lung:  CTAB, good air movement, no wheeze, no rhonchi Abdomen: soft/NT, +BS, nondistended, no peritoneal signs Ext: No cyanosis, No rashes, No petechiae, No lymphangitis, No edema Neuro: CNII-XII intact, strength 4/5 in bilateral upper and lower extremities, no dysmetria; scattered raised painful nodules on her pretibial area and medial thighs bilateral. No lymphangitis. No synovitis.  Labs on Admission:  Basic Metabolic Panel:  Recent Labs Lab 11/13/14 1506  NA 139  K 3.9  CL 109  CO2 21*  GLUCOSE 123*  BUN 9  CREATININE 0.58  CALCIUM 8.9   Liver Function Tests:  Recent Labs Lab 11/13/14 1506  AST 17  ALT 24  ALKPHOS 43  BILITOT 0.5  PROT 7.2  ALBUMIN 3.3*   No results for input(s): LIPASE, AMYLASE in the last 168 hours. No results for input(s): AMMONIA in the last 168 hours. CBC:  Recent Labs Lab 11/13/14 1506  WBC 7.5  NEUTROABS 5.4  HGB 12.5  HCT 37.7  MCV 82.5  PLT 352   Cardiac Enzymes: No results for input(s): CKTOTAL, CKMB, CKMBINDEX, TROPONINI in the last 168  hours. BNP: Invalid input(s): POCBNP CBG: No results for input(s): GLUCAP in the last 168 hours.  Radiological Exams on Admission: Dg Chest 2 View  11/13/2014  CLINICAL DATA:  Fevers and pain, history of lupus EXAM: CHEST - 2 VIEW COMPARISON:  05/26/2013 FINDINGS: Cardiac shadow is stable. The lungs are clear bilaterally. No focal infiltrate or sizable effusion is seen. No bony abnormality is noted. IMPRESSION: No active disease. Electronically Signed   By: Alcide Clever M.D.   On: 11/13/2014 17:18  EKG: Independently reviewed. Pending    Time spent:60 minutes Code Status:   FULL Family Communication:   Mother updated  at bedside   Laurita Peron, DO  [facility] Pager [phone]  If 7PM-7AM, please contact night-coverage [email or web address] Password [email or web address] 11/13/2014, 5:50 PM

## 2014-11-13 NOTE — ED Notes (Signed)
Pt sts lupus flare with fever and pain and UTI: pt sent here for admission to treatment

## 2014-11-13 NOTE — ED Notes (Signed)
MD Glick at the bedside. 

## 2014-11-13 NOTE — ED Notes (Signed)
Attempted Report x1.   

## 2014-11-13 NOTE — ED Notes (Signed)
MD at the bedside  

## 2014-11-13 NOTE — ED Notes (Signed)
PT to be transported upstairs.

## 2014-11-14 ENCOUNTER — Encounter (HOSPITAL_COMMUNITY): Payer: Self-pay

## 2014-11-14 DIAGNOSIS — L52 Erythema nodosum: Secondary | ICD-10-CM

## 2014-11-14 DIAGNOSIS — E782 Mixed hyperlipidemia: Secondary | ICD-10-CM

## 2014-11-14 DIAGNOSIS — E038 Other specified hypothyroidism: Secondary | ICD-10-CM

## 2014-11-14 DIAGNOSIS — R072 Precordial pain: Secondary | ICD-10-CM

## 2014-11-14 LAB — CBC
HCT: 35 % — ABNORMAL LOW (ref 36.0–46.0)
Hemoglobin: 11.1 g/dL — ABNORMAL LOW (ref 12.0–15.0)
MCH: 26.6 pg (ref 26.0–34.0)
MCHC: 31.7 g/dL (ref 30.0–36.0)
MCV: 83.7 fL (ref 78.0–100.0)
PLATELETS: 315 10*3/uL (ref 150–400)
RBC: 4.18 MIL/uL (ref 3.87–5.11)
RDW: 14.3 % (ref 11.5–15.5)
WBC: 5.4 10*3/uL (ref 4.0–10.5)

## 2014-11-14 LAB — BASIC METABOLIC PANEL
Anion gap: 7 (ref 5–15)
BUN: 10 mg/dL (ref 6–20)
CALCIUM: 8.4 mg/dL — AB (ref 8.9–10.3)
CHLORIDE: 105 mmol/L (ref 101–111)
CO2: 25 mmol/L (ref 22–32)
CREATININE: 0.48 mg/dL (ref 0.44–1.00)
GFR calc non Af Amer: >60 mL/min (ref >60)
Glucose, Bld: 151 mg/dL — ABNORMAL HIGH (ref 65–99)
Potassium: 4.5 mmol/L (ref 3.5–5.1)
SODIUM: 137 mmol/L (ref 135–145)

## 2014-11-14 LAB — GLUCOSE, CAPILLARY
GLUCOSE-CAPILLARY: 136 mg/dL — AB (ref 65–99)
GLUCOSE-CAPILLARY: 191 mg/dL — AB (ref 65–99)
Glucose-Capillary: 137 mg/dL — ABNORMAL HIGH (ref 65–99)
Glucose-Capillary: 122 mg/dL — ABNORMAL HIGH (ref 65–99)

## 2014-11-14 MED ORDER — METHYLPREDNISOLONE SODIUM SUCC 125 MG IJ SOLR
125.0000 mg | Freq: Two times a day (BID) | INTRAMUSCULAR | Status: DC
  Administered 2014-11-14 – 2014-11-15 (×2): 125 mg via INTRAVENOUS
  Filled 2014-11-14 (×2): qty 2

## 2014-11-14 MED ORDER — HYDROCODONE-ACETAMINOPHEN 5-325 MG PO TABS
1.0000 | ORAL_TABLET | Freq: Four times a day (QID) | ORAL | Status: AC | PRN
  Administered 2014-11-14 – 2014-11-15 (×2): 1 via ORAL
  Filled 2014-11-14 (×2): qty 1

## 2014-11-14 MED ORDER — GLUCERNA SHAKE PO LIQD
237.0000 mL | ORAL | Status: DC
  Administered 2014-11-14: 237 mL via ORAL

## 2014-11-14 NOTE — Progress Notes (Signed)
Initial Nutrition Assessment  DOCUMENTATION CODES:   Obesity unspecified  INTERVENTION:   Provide Glucerna Shake po once daily, each supplement provides 220 kcal and 10 grams of protein.  Encourage adequate PO intake.   NUTRITION DIAGNOSIS:   Increased nutrient needs related to  (illness) as evidenced by estimated needs.  GOAL:   Patient will meet greater than or equal to 90% of their needs  MONITOR:   PO intake, Supplement acceptance, Weight trends, Labs, I & O's  REASON FOR ASSESSMENT:   Malnutrition Screening Tool    ASSESSMENT:   19 year old female with a history of systemic lupus erythematosus, erythema nodosum, eczema, Hashimoto's thyroiditis, prediabetes, mild cognitive impairment secondary to concussions, hyperlipidemia presents with one-week history of fevers and increasing erythema nodosum with associated pain on her bilateral legs.   Pt reports having a decreased appetite which has been ongoing over the past 2 weeks. Pt however reports still consuming 3 meals a day. Meal completion has been 100%. Weight has been stable. Pt does report consuming a protein shake at least once daily. RD to order Glucerna to aid in adequate caloric and protein needs. Pt with no observed significant fat or muscle mass loss.   Labs and medications reviewed.    Diet Order:  Diet Carb Modified Fluid consistency:: Thin; Room service appropriate?: Yes  Skin:  Reviewed, no issues  Last BM:  10/18  Height:   Ht Readings from Last 1 Encounters:  01/05/14 5\' 7"  (1.702 m) (86 %*, Z = 1.07)   * Growth percentiles are based on CDC 2-20 Years data.    Weight:   Wt Readings from Last 1 Encounters:  11/13/14 230 lb 11.2 oz (104.645 kg) (99 %*, Z = 2.33)   * Growth percentiles are based on CDC 2-20 Years data.    Ideal Body Weight:  61.36 kg  BMI:  Body mass index is 36.12 kg/(m^2).  Estimated Nutritional Needs:   Kcal:  1900-2100  Protein:  100-110 grams  Fluid:  1.9 - 2  L/day  EDUCATION NEEDS:   No education needs identified at this time  Roslyn SmilingStephanie Elois Averitt, MS, RD, LDN Pager # [phone] After hours/ weekend pager # [phone]

## 2014-11-14 NOTE — Progress Notes (Signed)
[facility]                                                                              Patient Demographics  Valerie Marquez, is a 19 y.o. female, DOB - Jul 11, 1995, ZOX:[id number]  Admit date - 11/13/2014   Admitting Physician Catarina Hartshorn, MD  Outpatient Primary MD for the patient is Beverely Low, MD  LOS - 1   Chief Complaint  Patient presents with  . Lupus  . Urinary Tract Infection      HPI on 11/13/2014 by Dr. Onalee Hua Tat 19 year old female with a history of systemic lupus erythematosus, erythema nodosum, eczema, Hashimoto's thyroiditis, prediabetes, mild cognitive impairment secondary to concussions, hyperlipidemia presents with one-week history of fevers and increasing erythema nodosum with associated pain on her bilateral legs. The patient saw her rheumatologist (Dr Nickola Major) on 11/12/2014 at which time, the patient was felt to have a flareup of her lupus and erythema nodosum. The patient was given an intramuscular injection of steroids and she started oral prednisone on 11/13/2014 with intention to taper. Blood work was obtained in the office and showed worsening inflammatory markers suggestive of her vasculitis flareup. In conjunction with her worsening pain and fevers, the patient was told to come to the emergency department. The patient has been having fevers at nighttime to 103.62F for the past week. During his fevers, the patient's mother states that she is occasionally confused, but when she is afebrile her mentation comes back to baseline. The patient has also been complaining of some substernal chest discomfort with some shortness of breath which she states that she usually gets with her lupus exacerbations. In the past week she has noted increasing erythema nodosum lesions on her bilateral lower extremities without any frank synovitis. She complains of headaches primarily when she has to fevers but resolves when her fevers improved. She denies any coughing, hemoptysis,  dysuria, hematuria, hematochezia, melena.  In the emergency department, the patient was afebrile and hemodynamically stable. Oxygen saturation was 100% on room air. BMP and CBC were essentially unremarkable except for some thrombocytopenia with platelets of 123,000. Chest x-ray was negative. Urinalysis did not show any pyuria but did show some microscopic hematuria.   Assessment & Plan   Exacerbation of erythema nodosum/arthritis -Spoke with patient's rheumatologist via phone, patient does not have a history of lupus, workup has been negative to date. -Continue IV Solu-Medrol, will taper dose  Fever -Possibly secondary to the above versus infection -Patient did have UA as an outpatient, urine culture currently pending -Chest x-ray and UA unremarkable here -Blood cultures pending -Patient has been afebrile for possibly 4 hours, no leukocytosis  Atypical chest pain -Reproducible with palpation -Likely secondary to the above  Hypothyroidism, Hashimoto's thyroiditis -Continue Synthroid  Prediabetes, impaired glucose tolerance -Likely secondary to multiple steroids -Continue insulin sliding scale CBG monitoring -Metformin held, hemoglobin A1c pending  GERD -Continue PPI  Mild cognitive impairment -Likely secondary to multiple concussions -At her baseline  Hyperlipidemia -Continue Zetia  Code Status: Full  Family Communication: None at bedside  Disposition Plan: Admitted  Time Spent in minutes   30 minutes  Procedures  None  Consults   Rheumatologist, via phone  DVT Prophylaxis  Lovenox  Lab Results  Component Value Date   PLT 315 11/14/2014    Medications  Scheduled Meds: . B-complex with vitamin C  1 tablet Oral BID  . cholecalciferol  1,000 Units Oral Daily  . enoxaparin (LOVENOX) injection  40 mg Subcutaneous Q24H  . ezetimibe  10 mg Oral Daily  . Influenza vac split quadrivalent PF  0.5 mL Intramuscular Tomorrow-1000  . insulin aspart  0-5 Units  Subcutaneous QHS  . insulin aspart  0-9 Units Subcutaneous TID WC  . levothyroxine  125 mcg Oral QAC breakfast  . loratadine  10 mg Oral QPM  . methylPREDNISolone (SOLU-MEDROL) injection  125 mg Intravenous Q6H  . pantoprazole  40 mg Oral BID  . pneumococcal 23 valent vaccine  0.5 mL Intramuscular Tomorrow-1000   Continuous Infusions: . sodium chloride Stopped (11/13/14 2043)  . sodium chloride 0.9 % 1,000 mL infusion 75 mL/hr at 11/13/14 2044   PRN Meds:.acetaminophen **OR** acetaminophen, HYDROcodone-acetaminophen, levalbuterol, ondansetron **OR** ondansetron (ZOFRAN) IV  Antibiotics    Anti-infectives    None      Subjective:   Valerie Marquez seen and examined today.  Patient states she is feeling better as compared to previous days.  Denies shortness of breath, chest pain, dizziness or headache, abdominal pain at this time.  Objective:   Filed Vitals:   11/13/14 1815 11/13/14 1845 11/13/14 2100 11/14/14 0500  BP: 112/80 111/65 121/70 116/64  Pulse: 80 73 91 72  Temp:   98.2 F (36.8 C) 97.5 F (36.4 C)  TempSrc:   Oral Oral  Resp: 16 25 18 16   Weight:   104.645 kg (230 lb 11.2 oz)   SpO2: 94% 98% 100% 98%    Wt Readings from Last 3 Encounters:  11/13/14 104.645 kg (230 lb 11.2 oz) (99 %*, Z = 2.33)  05/24/14 100.699 kg (222 lb) (99 %*, Z = 2.23)  01/09/14 107.049 kg (236 lb) (99 %*, Z = 2.36)   * Growth percentiles are based on CDC 2-20 Years data.     Intake/Output Summary (Last 24 hours) at 11/14/14 1112 Last data filed at 11/14/14 [id number]  Gross per 24 hour  Intake 961.25 ml  Output      0 ml  Net 961.25 ml    Exam  General: Well developed, well nourished, NAD, appears stated age  HEENT: NCAT,mucous membranes moist.   Cardiovascular: S1 S2 auscultated, no rubs, murmurs or gallops. Regular rate and rhythm.  Respiratory: Clear to auscultation bilaterally with equal chest rise  Abdomen: Soft, obese, nontender, nondistended, + bowel  sounds  Extremities: warm dry without cyanosis clubbing or edema  Neuro: AAOx3, nonfocal  Skin: Scattered nodules noted on patient's lower extremities bilaterally  Psych: Normal affect and demeanor with intact judgement and insight  Data Review   Micro Results No results found for this or any previous visit (from the past 240 hour(s)).  Radiology Reports Dg Chest 2 View  11/13/2014  CLINICAL DATA:  Fevers and pain, history of lupus EXAM: CHEST - 2 VIEW COMPARISON:  05/26/2013 FINDINGS: Cardiac shadow is stable. The lungs are clear bilaterally. No focal infiltrate or sizable effusion is seen. No bony abnormality is noted. IMPRESSION: No active disease. Electronically Signed   By: Alcide CleverMark  Lukens M.D.   On: 11/13/2014 17:18    CBC  Recent Labs Lab 11/13/14 1506 11/14/14 0344  WBC 7.5 5.4  HGB 12.5 11.1*  HCT 37.7 35.0*  PLT 352 315  MCV 82.5 83.7  MCH 27.4 26.6  MCHC 33.2 31.7  RDW 14.1 14.3  LYMPHSABS 1.2  --   MONOABS 0.7  --   EOSABS 0.0  --   BASOSABS 0.0  --     Chemistries   Recent Labs Lab 11/13/14 1506 11/14/14 0344  NA 139 137  K 3.9 4.5  CL 109 105  CO2 21* 25  GLUCOSE 123* 151*  BUN 9 10  CREATININE 0.58 0.48  CALCIUM 8.9 8.4*  AST 17  --   ALT 24  --   ALKPHOS 43  --   BILITOT 0.5  --    ------------------------------------------------------------------------------------------------------------------ CrCl cannot be calculated (Unknown ideal weight.). ------------------------------------------------------------------------------------------------------------------ No results for input(s): HGBA1C in the last 72 hours. ------------------------------------------------------------------------------------------------------------------ No results for input(s): CHOL, HDL, LDLCALC, TRIG, CHOLHDL, LDLDIRECT in the last 72 hours. ------------------------------------------------------------------------------------------------------------------ No results  for input(s): TSH, T4TOTAL, T3FREE, THYROIDAB in the last 72 hours.  Invalid input(s): FREET3 ------------------------------------------------------------------------------------------------------------------ No results for input(s): VITAMINB12, FOLATE, FERRITIN, TIBC, IRON, RETICCTPCT in the last 72 hours.  Coagulation profile No results for input(s): INR, PROTIME in the last 168 hours.   Recent Labs  11/13/14 1958  DDIMER 1.20*    Cardiac Enzymes No results for input(s): CKMB, TROPONINI, MYOGLOBIN in the last 168 hours.  Invalid input(s): CK ------------------------------------------------------------------------------------------------------------------ Invalid input(s): POCBNP    Dariyah Garduno D.O. on 11/14/2014 at 11:12 AM  Between 7am to 7pm - Pager - [phone]  After 7pm go to [email or web address] - password [email or web address]  And look for the night coverage person covering for me after hours  [facility] Office  [phone]

## 2014-11-15 DIAGNOSIS — D72829 Elevated white blood cell count, unspecified: Secondary | ICD-10-CM

## 2014-11-15 LAB — GLUCOSE, CAPILLARY
GLUCOSE-CAPILLARY: 156 mg/dL — AB (ref 65–99)
GLUCOSE-CAPILLARY: 157 mg/dL — AB (ref 65–99)
GLUCOSE-CAPILLARY: 152 mg/dL — AB (ref 65–99)
GLUCOSE-CAPILLARY: 122 mg/dL — AB (ref 65–99)
Glucose-Capillary: 184 mg/dL — ABNORMAL HIGH (ref 65–99)

## 2014-11-15 LAB — CBC
HEMATOCRIT: 33.6 % — AB (ref 36.0–46.0)
HEMOGLOBIN: 10.7 g/dL — AB (ref 12.0–15.0)
MCH: 26.7 pg (ref 26.0–34.0)
MCHC: 31.8 g/dL (ref 30.0–36.0)
MCV: 83.8 fL (ref 78.0–100.0)
PLATELETS: 354 10*3/uL (ref 150–400)
RBC: 4.01 MIL/uL (ref 3.87–5.11)
RDW: 14.4 % (ref 11.5–15.5)
WBC: 11.6 10*3/uL — ABNORMAL HIGH (ref 4.0–10.5)

## 2014-11-15 LAB — BASIC METABOLIC PANEL
Anion gap: 8 (ref 5–15)
BUN: 13 mg/dL (ref 6–20)
CHLORIDE: 108 mmol/L (ref 101–111)
CO2: 24 mmol/L (ref 22–32)
Calcium: 8.3 mg/dL — ABNORMAL LOW (ref 8.9–10.3)
Creatinine, Ser: 0.55 mg/dL (ref 0.44–1.00)
Glucose, Bld: 160 mg/dL — ABNORMAL HIGH (ref 65–99)
POTASSIUM: 4 mmol/L (ref 3.5–5.1)
SODIUM: 140 mmol/L (ref 135–145)

## 2014-11-15 LAB — C4 COMPLEMENT: Complement C4, Body Fluid: 56 mg/dL — ABNORMAL HIGH (ref 14–44)

## 2014-11-15 LAB — COMPLEMENT, TOTAL

## 2014-11-15 LAB — C3 COMPLEMENT: C3 COMPLEMENT: 193 mg/dL — AB (ref 82–167)

## 2014-11-15 LAB — HEMOGLOBIN A1C
HEMOGLOBIN A1C: 6 % — AB (ref 4.8–5.6)
Mean Plasma Glucose: 126 mg/dL

## 2014-11-15 MED ORDER — INFLUENZA VAC SPLIT QUAD 0.5 ML IM SUSY
0.5000 mL | PREFILLED_SYRINGE | INTRAMUSCULAR | Status: AC
  Administered 2014-11-16: 0.5 mL via INTRAMUSCULAR
  Filled 2014-11-15: qty 0.5

## 2014-11-15 MED ORDER — METHYLPREDNISOLONE SODIUM SUCC 125 MG IJ SOLR
125.0000 mg | Freq: Every day | INTRAMUSCULAR | Status: DC
  Administered 2014-11-16: 125 mg via INTRAVENOUS
  Filled 2014-11-15: qty 2

## 2014-11-15 MED ORDER — PNEUMOCOCCAL VAC POLYVALENT 25 MCG/0.5ML IJ INJ
0.5000 mL | INJECTION | INTRAMUSCULAR | Status: AC
  Administered 2014-11-16: 0.5 mL via INTRAMUSCULAR
  Filled 2014-11-15: qty 0.5

## 2014-11-15 MED ORDER — HYDROCODONE-ACETAMINOPHEN 5-325 MG PO TABS
1.0000 | ORAL_TABLET | Freq: Four times a day (QID) | ORAL | Status: DC | PRN
  Administered 2014-11-15: 1 via ORAL
  Filled 2014-11-15: qty 1

## 2014-11-15 NOTE — Progress Notes (Signed)
[facility]                                                                              Patient Demographics  Valerie RotaSamantha Marquez, is Marquez 19 y.o. female, DOB - 04-08-1995, ZOX:[id number]  Admit date - 11/13/2014   Admitting Physician Valerie Hartshornavid Tat, MD  Outpatient Primary MD for the patient is Valerie LowSUMNER,BRIAN A, MD  LOS - 2   Chief Complaint  Patient presents with  . Lupus  . Urinary Tract Infection      HPI on 11/13/2014 by Dr. Onalee Huaavid Marquez 19 year old female with Marquez history of systemic lupus erythematosus, erythema nodosum, eczema, Hashimoto's thyroiditis, prediabetes, mild cognitive impairment secondary to concussions, hyperlipidemia presents with one-week history of fevers and increasing erythema nodosum with associated pain on her bilateral legs. The patient saw her rheumatologist (Dr Valerie Marquez) on 11/12/2014 at which time, the patient was felt to have Marquez flareup of her lupus and erythema nodosum. The patient was given an intramuscular injection of steroids and she started oral prednisone on 11/13/2014 with intention to taper. Blood work was obtained in the office and showed worsening inflammatory markers suggestive of her vasculitis flareup. In conjunction with her worsening pain and fevers, the patient was told to come to the emergency department. The patient has been having fevers at nighttime to 103.61F for the past week. During his fevers, the patient's mother states that she is occasionally confused, but when she is afebrile her mentation comes back to baseline. The patient has also been complaining of some substernal chest discomfort with some shortness of breath which she states that she usually gets with her lupus exacerbations. In the past week she has noted increasing erythema nodosum lesions on her bilateral lower extremities without any frank synovitis. She complains of headaches primarily when she has to fevers but resolves when her fevers improved. She denies any coughing, hemoptysis,  dysuria, hematuria, hematochezia, melena.  In the emergency department, the patient was afebrile and hemodynamically stable. Oxygen saturation was 100% on room air. BMP and CBC were essentially unremarkable except for some thrombocytopenia with platelets of 123,000. Chest x-ray was negative. Urinalysis did not show any pyuria but did show some microscopic hematuria.   Assessment & Plan   Exacerbation of erythema nodosum/arthritis -Spoke with patient's rheumatologist via phone 11/14/2014, patient does not have Marquez history of lupus, workup has been negative to date. -Continue IV Solu-Medrol, will continue to taper dose  Fever -Possibly secondary to the above versus infection -Patient did have UA as an outpatient, urine culture currently pending (Rheumatology will provide me results if patient is still admitted) -Chest x-ray and UA unremarkable here -Blood cultures show no growth to date -Patient has been afebrile for possibly 48 hours  Atypical chest pain -Resolved, was reproducible with palpation -Likely secondary to the above  Hypothyroidism, Hashimoto's thyroiditis -Continue Synthroid  Leukocytosis  -Likely steroid induced -Infectious workup unremarkable thus far -Continue to monitor CBC  Prediabetes, impaired glucose tolerance -Likely secondary to multiple steroids -Continue insulin sliding scale CBG monitoring -Metformin held, hemoglobin A1c 6.0  GERD -Continue PPI  Mild cognitive impairment -Likely secondary to multiple concussions -At her baseline  Hyperlipidemia -Continue Zetia  Normocytic anemia, chronic -Baseline  hemoglobin 11, currently 10.7 -Likely dilutional component -Continue to monitor CBC  Code Status: Full  Family Communication: None at bedside  Disposition Plan: Admitted. Continue to taper steroids, possible d/c 10/21  Time Spent in minutes   30 minutes  Procedures  None  Consults   Rheumatologist, via phone  DVT Prophylaxis   Lovenox  Lab Results  Component Value Date   PLT 354 11/15/2014    Medications  Scheduled Meds: . B-complex with vitamin C  1 tablet Oral BID  . cholecalciferol  1,000 Units Oral Daily  . enoxaparin (LOVENOX) injection  40 mg Subcutaneous Q24H  . ezetimibe  10 mg Oral Daily  . feeding supplement (GLUCERNA SHAKE)  237 mL Oral Q24H  . [START ON 11/16/2014] Influenza vac split quadrivalent PF  0.5 mL Intramuscular Tomorrow-1000  . insulin aspart  0-5 Units Subcutaneous QHS  . insulin aspart  0-9 Units Subcutaneous TID WC  . levothyroxine  125 mcg Oral QAC breakfast  . loratadine  10 mg Oral QPM  . methylPREDNISolone (SOLU-MEDROL) injection  125 mg Intravenous Q12H  . pantoprazole  40 mg Oral BID  . [START ON 11/16/2014] pneumococcal 23 valent vaccine  0.5 mL Intramuscular Tomorrow-1000   Continuous Infusions:   PRN Meds:.acetaminophen **OR** acetaminophen, levalbuterol, ondansetron **OR** ondansetron (ZOFRAN) IV  Antibiotics    Anti-infectives    None      Subjective:   Valerie Marquez seen and examined today.  Patient states she is feeling better as compared to previous days.  Does have facial redness (although patient seen rubbing her face upon my entering the room).  Denies shortness of breath, chest pain, dizziness or headache, abdominal pain at this time.  Objective:   Filed Vitals:   11/14/14 1818 11/14/14 2052 11/15/14 0424 11/15/14 0840  BP: 132/87 115/67 116/56 103/53  Pulse: 84 76 59 74  Temp: 97.4 F (36.3 C) 98.7 F (37.1 C) 97.8 F (36.6 C) 97.4 F (36.3 C)  TempSrc: Oral   Oral  Resp: Height:      Weight:      SpO2: 98% 98% 98% 98%    Wt Readings from Last 3 Encounters:  11/13/14 104.645 kg (230 lb 11.2 oz) (99 %*, Z = 2.33)  05/24/14 100.699 kg (222 lb) (99 %*, Z = 2.23)  01/09/14 107.049 kg (236 lb) (99 %*, Z = 2.36)   * Growth percentiles are based on CDC 2-20 Years data.     Intake/Output Summary (Last 24 hours) at 11/15/14  1229 Last data filed at 11/15/14 1051  Gross per 24 hour  Intake   1500 ml  Output      2 ml  Net   1498 ml    Exam  General: Well developed, well nourished, NAD  HEENT: NCAT,mucous membranes moist.   Cardiovascular: S1 S2 auscultated, RRR, no murmurs  Respiratory: Clear to auscultation bilaterally  Abdomen: Soft, obese, nontender, nondistended, + bowel sounds  Extremities: warm dry without cyanosis clubbing or edema  Neuro: AAOx3, nonfocal  Skin: Scattered nodules noted on patient's lower extremities bilaterally  Psych: Normal affect and demeanor   Data Review   Micro Results Recent Results (from the past 240 hour(s))  Culture, blood (routine x 2)     Status: None (Preliminary result)   Collection Time: 11/13/14  7:53 PM  Result Value Ref Range Status   Specimen Description BLOOD LEFT HAND  Final   Special Requests BOTTLES DRAWN AEROBIC AND ANAEROBIC 5CC  Final  Culture NO GROWTH < 24 HOURS  Final   Report Status PENDING  Incomplete  Culture, blood (routine x 2)     Status: None (Preliminary result)   Collection Time: 11/13/14  7:58 PM  Result Value Ref Range Status   Specimen Description BLOOD LEFT ANTECUBITAL  Final   Special Requests BOTTLES DRAWN AEROBIC AND ANAEROBIC 5CC  Final   Culture NO GROWTH < 24 HOURS  Final   Report Status PENDING  Incomplete    Radiology Reports Dg Chest 2 View  11/13/2014  CLINICAL DATA:  Fevers and pain, history of lupus EXAM: CHEST - 2 VIEW COMPARISON:  05/26/2013 FINDINGS: Cardiac shadow is stable. The lungs are clear bilaterally. No focal infiltrate or sizable effusion is seen. No bony abnormality is noted. IMPRESSION: No active disease. Electronically Signed   By: Alcide Clever M.D.   On: 11/13/2014 17:18    CBC  Recent Labs Lab 11/13/14 1506 11/14/14 0344 11/15/14 0513  WBC 7.5 5.4 11.6*  HGB 12.5 11.1* 10.7*  HCT 37.7 35.0* 33.6*  PLT 352 315 354  MCV 82.5 83.7 83.8  MCH 27.4 26.6 26.7  MCHC 33.2 31.7 31.8   RDW 14.1 14.3 14.4  LYMPHSABS 1.2  --   --   MONOABS 0.7  --   --   EOSABS 0.0  --   --   BASOSABS 0.0  --   --     Chemistries   Recent Labs Lab 11/13/14 1506 11/14/14 0344 11/15/14 0513  NA 139 137 140  K 3.9 4.5 4.0  CL 109 105 108  CO2 21* 25 24  GLUCOSE 123* 151* 160*  BUN CREATININE 0.58 0.48 0.55  CALCIUM 8.9 8.4* 8.3*  AST 17  --   --   ALT 24  --   --   ALKPHOS 43  --   --   BILITOT 0.5  --   --    ------------------------------------------------------------------------------------------------------------------ estimated creatinine clearance is 140.7 mL/min (by C-G formula based on Cr of 0.55). ------------------------------------------------------------------------------------------------------------------  Recent Labs  11/14/14 0344  HGBA1C 6.0*   ------------------------------------------------------------------------------------------------------------------ No results for input(s): CHOL, HDL, LDLCALC, TRIG, CHOLHDL, LDLDIRECT in the last 72 hours. ------------------------------------------------------------------------------------------------------------------ No results for input(s): TSH, T4TOTAL, T3FREE, THYROIDAB in the last 72 hours.  Invalid input(s): FREET3 ------------------------------------------------------------------------------------------------------------------ No results for input(s): VITAMINB12, FOLATE, FERRITIN, TIBC, IRON, RETICCTPCT in the last 72 hours.  Coagulation profile No results for input(s): INR, PROTIME in the last 168 hours.   Recent Labs  11/13/14 1958  DDIMER 1.20*    Cardiac Enzymes No results for input(s): CKMB, TROPONINI, MYOGLOBIN in the last 168 hours.  Invalid input(s): CK ------------------------------------------------------------------------------------------------------------------ Invalid input(s): POCBNP    Dedra Matsuo D.O. on 11/15/2014 at 12:29 PM  Between 7am to 7pm - Pager -  [phone]  After 7pm go to [email or web address] - password [email or web address]  And look for the night coverage person covering for me after hours  [facility] Office  [phone]

## 2014-11-16 DIAGNOSIS — R509 Fever, unspecified: Secondary | ICD-10-CM

## 2014-11-16 DIAGNOSIS — D649 Anemia, unspecified: Secondary | ICD-10-CM

## 2014-11-16 DIAGNOSIS — R7303 Prediabetes: Secondary | ICD-10-CM

## 2014-11-16 LAB — CBC
HCT: 34.8 % — ABNORMAL LOW (ref 36.0–46.0)
Hemoglobin: 10.9 g/dL — ABNORMAL LOW (ref 12.0–15.0)
MCH: 26.6 pg (ref 26.0–34.0)
MCHC: 31.3 g/dL (ref 30.0–36.0)
MCV: 84.9 fL (ref 78.0–100.0)
PLATELETS: 419 10*3/uL — AB (ref 150–400)
RBC: 4.1 MIL/uL (ref 3.87–5.11)
RDW: 14.4 % (ref 11.5–15.5)
WBC: 13.1 10*3/uL — AB (ref 4.0–10.5)

## 2014-11-16 LAB — BASIC METABOLIC PANEL
ANION GAP: 6 (ref 5–15)
BUN: 13 mg/dL (ref 6–20)
CALCIUM: 8.4 mg/dL — AB (ref 8.9–10.3)
CO2: 29 mmol/L (ref 22–32)
Chloride: 102 mmol/L (ref 101–111)
Creatinine, Ser: 0.54 mg/dL (ref 0.44–1.00)
GFR calc Af Amer: >60 mL/min (ref >60)
GLUCOSE: 143 mg/dL — AB (ref 65–99)
Potassium: 3.9 mmol/L (ref 3.5–5.1)
SODIUM: 137 mmol/L (ref 135–145)

## 2014-11-16 LAB — GLUCOSE, CAPILLARY
GLUCOSE-CAPILLARY: 105 mg/dL — AB (ref 65–99)
Glucose-Capillary: 110 mg/dL — ABNORMAL HIGH (ref 65–99)

## 2014-11-16 MED ORDER — PREDNISONE 10 MG PO TABS: ORAL_TABLET | ORAL | Status: DC

## 2014-11-16 NOTE — Discharge Summary (Signed)
Physician Discharge Summary  Valerie Marquez ZOX:[id number] DOB: 03/21/1995 DOA: 11/13/2014  PCP: Beverely Low, MD  Admit date: 11/13/2014 Discharge date: 11/16/2014  Time spent: 45 minutes  Recommendations for Outpatient Follow-up:  Patient will be discharged to home.  Patient will need to follow up with primary care provider within one week of discharge, repeat CBC.  Follow up with rheumatology in one week. Patient should continue medications as prescribed.  Patient should follow a carb modified diet.   Discharge Diagnoses:  Exacerbation of erythema nodosum/arthritis Fever Atypical chest pain Hypothyroidism Leukocytosis Prediabetes, impaired glucose tolerance GERD Mild cognitive impairment Hyperlipidemia Normocytic anemia, chronic  Discharge Condition: Stable  Diet recommendation: Carb Modified  Filed Weights   11/13/14 2100 11/15/14 2113  Weight: 104.645 kg (230 lb 11.2 oz) 105.01 kg (231 lb 8.1 oz)    History of present illness:  on 11/13/2014 by Dr. Onalee Hua Tat 19 year old female with a history of systemic lupus erythematosus, erythema nodosum, eczema, Hashimoto's thyroiditis, prediabetes, mild cognitive impairment secondary to concussions, hyperlipidemia presents with one-week history of fevers and increasing erythema nodosum with associated pain on her bilateral legs. The patient saw her rheumatologist (Dr Nickola Major) on 11/12/2014 at which time, the patient was felt to have a flareup of her lupus and erythema nodosum. The patient was given an intramuscular injection of steroids and she started oral prednisone on 11/13/2014 with intention to taper. Blood work was obtained in the office and showed worsening inflammatory markers suggestive of her vasculitis flareup. In conjunction with her worsening pain and fevers, the patient was told to come to the emergency department. The patient has been having fevers at nighttime to 103.26F for the past week. During his fevers, the  patient's mother states that she is occasionally confused, but when she is afebrile her mentation comes back to baseline. The patient has also been complaining of some substernal chest discomfort with some shortness of breath which she states that she usually gets with her lupus exacerbations. In the past week she has noted increasing erythema nodosum lesions on her bilateral lower extremities without any frank synovitis. She complains of headaches primarily when she has to fevers but resolves when her fevers improved. She denies any coughing, hemoptysis, dysuria, hematuria, hematochezia, melena.  In the emergency department, the patient was afebrile and hemodynamically stable. Oxygen saturation was 100% on room air. BMP and CBC were essentially unremarkable except for some thrombocytopenia with platelets of 123,000. Chest x-ray was negative. Urinalysis did not show any pyuria but did show some microscopic hematuria.   Hospital Course:  Exacerbation of erythema nodosum/arthritis -Spoke with patient's rheumatologist via phone 11/14/2014, patient does not have a history of lupus, workup has been negative to date. -Initially placed on IV Solu-Medrol -Per rheumatology recommendations, will discharge patient with long steroid taper.  She will need to follow-up with dermatology in 2-3 weeks.  Fever -Possibly secondary to the above versus infection -Patient did have UA as an outpatient, urine culture currently pending (Rheumatology will provide me results if patient is still admitted) -Chest x-ray and UA unremarkable here -Blood cultures show no growth to date -Patient has been afebrile for possibly 72 hours  Atypical chest pain -Resolved, was reproducible with palpation -Likely secondary to the above  Hypothyroidism, Hashimoto's thyroiditis -Continue Synthroid  Leukocytosis  -Likely steroid induced -Infectious workup unremarkable thus far -Continue to monitor CBC  Prediabetes, impaired  glucose tolerance -Likely secondary to multiple steroids -During Hospitalization, was placed on insulin sliding scale CBG monitoring -Metformin held, hemoglobin A1c  6.0 -Continue metformin upon discharge  GERD -Continue PPI  Mild cognitive impairment -Likely secondary to multiple concussions -At her baseline  Hyperlipidemia -Continue Zetia  Normocytic anemia, chronic -Baseline hemoglobin 11, currently 10.9 -Repeat CBC in 1 week  Procedures  None  Consults  Rheumatologist, via phone  Discharge Exam: Filed Vitals:   11/16/14 0804  BP: 118/61  Pulse: 62  Temp: 97.7 F (36.5 C)  Resp: 17   Exam  General: Well developed, well nourished, NAD  HEENT: NCAT,mucous membranes moist.   Cardiovascular: S1 S2 auscultated, RRR, no murmurs  Respiratory: Clear to auscultation  Abdomen: Soft, obese, nontender, nondistended, + bowel sounds  Extremities: warm dry without cyanosis clubbing or edema  Neuro: AAOx3, nonfocal  Skin: Scattered nodules noted on patient's lower extremities bilaterally  Psych: Normal affect and demeanor  Discharge Instructions      Discharge Instructions    Discharge instructions    Complete by:  As directed   Patient will be discharged to home.  Patient will need to follow up with primary care provider within one week of discharge, repeat CBC.  Patient should continue medications as prescribed.  Patient should follow a carb modified diet.            Medication List    TAKE these medications        acetaminophen 500 MG tablet  Commonly known as:  TYLENOL  Take 1,000 mg by mouth every 4 (four) hours as needed for fever (alternating with ibuprofen).     albuterol (2.5 MG/3ML) 0.083% nebulizer solution  Commonly known as:  PROVENTIL  Take 2.5 mg by nebulization every 6 (six) hours as needed for wheezing.     B-complex with vitamin C tablet  Take 1 tablet by mouth 2 (two) times daily.     CALCIUM 500/D 500-200 MG-UNIT tablet    Generic drug:  calcium-vitamin D  1 tablet.     desonide 0.05 % cream  Commonly known as:  DESOWEN  Apply topically 2 (two) times daily. Apply topically 2 times daily. As needed for dry skin     EPIPEN 2-PAK 0.3 mg/0.3 mL Soaj injection  Generic drug:  EPINEPHrine  Inject 0.3 mg into the muscle once as needed (for severe allergic reation).     ergocalciferol 50000 UNITS capsule  Commonly known as:  VITAMIN D2  Take 1 capsule once a week for 12 weeks     ezetimibe 10 MG tablet  Commonly known as:  ZETIA  Take 1 tablet (10 mg total) by mouth daily.     ibuprofen 200 MG tablet  Commonly known as:  ADVIL,MOTRIN  Take 600 mg by mouth every 6 (six) hours as needed for pain or fever (alternating with tlyenol).     levalbuterol 45 MCG/ACT inhaler  Commonly known as:  XOPENEX HFA  Inhale 1-2 puffs into the lungs every 6 (six) hours as needed for wheezing or shortness of breath.     levocetirizine 5 MG tablet  Commonly known as:  XYZAL  Take 1 tablet (5 mg total) by mouth every evening.     metFORMIN 1000 MG tablet  Commonly known as:  GLUCOPHAGE  Take 1 tablet (1,000 mg total) by mouth 2 (two) times daily with a meal.     MULTI-VITAMINS Tabs  1 tablet.     nortriptyline 10 MG capsule  Commonly known as:  PAMELOR  Take 20 mg by mouth at bedtime. Patient takes 2-10mg  pills at bed time  NOVOFINE 32G X 6 MM Misc  Generic drug:  Insulin Pen Needle  USE 1 NEEDLE DAILY     pantoprazole 40 MG tablet  Commonly known as:  PROTONIX  Take 1 tablet (40 mg total) by mouth 2 (two) times daily.     PATADAY OP  Apply 2 drops to eye daily. Used for allergy/dry eye     predniSONE 10 MG tablet  Commonly known as:  DELTASONE  Take 4 tablets daily for 7 days, then Take 3 tablets daily for 7 days, then Take 2 tablets daily for 7 days, then Take 1 tablet daily for 7 days.     SYNTHROID 125 MCG tablet  Generic drug:  levothyroxine  Take 1 tablet (125 mcg total) by mouth daily.      traMADol 50 MG tablet  Commonly known as:  ULTRAM       Allergies  Allergen Reactions  . Corn-Containing Products   . Peanut-Containing Drug Products   . Amoxicillin Nausea And Vomiting and Rash    No reaction listed  . Other Rash    Corn, soy, nuts (all types), cats, dogs   Follow-up Information    Follow up with SUMNER,BRIAN A, MD. Schedule an appointment as soon as possible for a visit in 1 week.   Specialty:  Pediatrics   Why:  Hospital follow up   Contact information:   [address] [place] [address] [phone]        The results of significant diagnostics from this hospitalization (including imaging, microbiology, ancillary and laboratory) are listed below for reference.    Significant Diagnostic Studies: Dg Chest 2 View  11/13/2014  CLINICAL DATA:  Fevers and pain, history of lupus EXAM: CHEST - 2 VIEW COMPARISON:  05/26/2013 FINDINGS: Cardiac shadow is stable. The lungs are clear bilaterally. No focal infiltrate or sizable effusion is seen. No bony abnormality is noted. IMPRESSION: No active disease. Electronically Signed   By: Alcide CleverMark  Lukens M.D.   On: 11/13/2014 17:18    Microbiology: Recent Results (from the past 240 hour(s))  Culture, blood (routine x 2)     Status: None (Preliminary result)   Collection Time: 11/13/14  7:53 PM  Result Value Ref Range Status   Specimen Description BLOOD LEFT HAND  Final   Special Requests BOTTLES DRAWN AEROBIC AND ANAEROBIC 5CC  Final   Culture NO GROWTH 2 DAYS  Final   Report Status PENDING  Incomplete  Culture, blood (routine x 2)     Status: None (Preliminary result)   Collection Time: 11/13/14  7:58 PM  Result Value Ref Range Status   Specimen Description BLOOD LEFT ANTECUBITAL  Final   Special Requests BOTTLES DRAWN AEROBIC AND ANAEROBIC 5CC  Final   Culture NO GROWTH 2 DAYS  Final   Report Status PENDING  Incomplete     Labs: Basic Metabolic Panel:  Recent Labs Lab 11/13/14 1506 11/14/14 0344  11/15/14 0513 11/16/14 0339  NA 139 137 140 137  K 3.9 4.5 4.0 3.9  CL 109 105 108 102  CO2 21* 25 24 29   GLUCOSE 123* 151* 160* 143*  BUN 9 10 13 13   CREATININE 0.58 0.48 0.55 0.54  CALCIUM 8.9 8.4* 8.3* 8.4*   Liver Function Tests:  Recent Labs Lab 11/13/14 1506  AST 17  ALT 24  ALKPHOS 43  BILITOT 0.5  PROT 7.2  ALBUMIN 3.3*   No results for input(s): LIPASE, AMYLASE in the last 168 hours. No results for input(s): AMMONIA  in the last 168 hours. CBC:  Recent Labs Lab 11/13/14 1506 11/14/14 0344 11/15/14 0513 11/16/14 0339  WBC 7.5 5.4 11.6* 13.1*  NEUTROABS 5.4  --   --   --   HGB 12.5 11.1* 10.7* 10.9*  HCT 37.7 35.0* 33.6* 34.8*  MCV 82.5 83.7 83.8 84.9  PLT 352 315 354 419*   Cardiac Enzymes: No results for input(s): CKTOTAL, CKMB, CKMBINDEX, TROPONINI in the last 168 hours. BNP: BNP (last 3 results) No results for input(s): BNP in the last 8760 hours.  ProBNP (last 3 results) No results for input(s): PROBNP in the last 8760 hours.  CBG:  Recent Labs Lab 11/15/14 0743 11/15/14 1141 11/15/14 1654 11/15/14 2137 11/16/14 0731  GLUCAP 157* 152* 184* 122* 105*       Signed:  Saryah Loper  [facility] 11/16/2014, 10:11 AM

## 2014-11-16 NOTE — Discharge Instructions (Signed)
Erythema Nodosum  Erythema nodosum is a skin condition in which patches of fat under the skin of the lower legs become inflamed. This causes painful bumps (nodules) to form.  CAUSES  Common causes of this condition include:   Infections.   Certain medicines, especially birth control pills, penicillin, and sulfa medicines.  Other causes include:   Pregnancy.   Certain inflammatory conditions, including Lupus, Crohn's disease, and thyroid conditions.  In some cases, the cause may not be known.  RISK FACTORS  This condition is more likely to develop in young adult women.  SYMPTOMS  The main symptom of this condition is large nodules that look like raised bruises and are tender to the touch. These nodules usually appear on the shins, but they may also appear on the arms or the trunk. They gradually change in color from pink to brown, and they leave a dark mark that clears up in several months.  Other symptoms include:   Fever.   Fatigue.   Joint pain.   Itchiness.  DIAGNOSIS  This condition is diagnosed based on symptoms. To find the underlying condition that caused the erythema nodosum, your health care provider may also do a physical exam, X-rays, and blood tests.  TREATMENT  Treatment for this condition depends on the cause. The nodules usually go away with treatment of the underlying condition. Any pain or discomfort may be treated with:   Anti-inflammatory medicines.   Bed rest.   Raising (elevating) the affected area.   Cool compresses.  In some cases, steroids and potassium iodide tablets may be given.  HOME CARE INSTRUCTIONS   Take medicines only as directed by your health care provider.   Stay in bed for as long as directed by your health care provider.   Until your symptoms go away, limit any exercising that makes you breathe harder and faster (vigorous).   Elevate the affected leg as directed by your health care provider.   Apply cool compresses to the affected area as directed by your health  care provider.  SEEK MEDICAL CARE IF:   Your symptoms are not improving.   You have a fever that does not go away.  SEEK IMMEDIATE MEDICAL CARE IF:   Your condition gets worse.   Your pain gets worse.   You have a sore throat.   You vomit repeatedly.     This information is not intended to replace advice given to you by your health care provider. Make sure you discuss any questions you have with your health care provider.     Document Released: 02/20/2004 Document Revised: 05/29/2014 Document Reviewed: 12/20/2013  Elsevier Interactive Patient Education 2016 [employer]

## 2014-11-16 NOTE — Progress Notes (Signed)
Pt being discharged home via wheelchair with family. Pt alert and oriented x4. VSS. Pt c/o no pain at this time. No signs of respiratory distress. Education complete and care plans resolved. IV removed with catheter intact and pt tolerated well. No further issues at this time. Pt to follow up with PCP. Ahley Bulls R, RN 

## 2014-11-18 LAB — CULTURE, BLOOD (ROUTINE X 2)
CULTURE: NO GROWTH
Culture: NO GROWTH

## 2014-12-15 ENCOUNTER — Other Ambulatory Visit: Payer: Self-pay | Admitting: Allergy and Immunology

## 2014-12-22 ENCOUNTER — Other Ambulatory Visit: Payer: Self-pay | Admitting: Pediatric Endocrinology

## 2015-01-07 ENCOUNTER — Ambulatory Visit: Payer: 59 | Admitting: Obstetrics and Gynecology

## 2015-01-15 ENCOUNTER — Encounter: Payer: Self-pay | Admitting: Pediatric Endocrinology

## 2015-01-15 ENCOUNTER — Ambulatory Visit (INDEPENDENT_AMBULATORY_CARE_PROVIDER_SITE_OTHER): Payer: 59 | Admitting: Pediatric Endocrinology

## 2015-01-15 VITALS — BP 131/68 | Wt 245.6 lb

## 2015-01-15 DIAGNOSIS — E119 Type 2 diabetes mellitus without complications: Secondary | ICD-10-CM | POA: Diagnosis not present

## 2015-01-15 DIAGNOSIS — E782 Mixed hyperlipidemia: Secondary | ICD-10-CM

## 2015-01-15 DIAGNOSIS — R1013 Epigastric pain: Secondary | ICD-10-CM | POA: Diagnosis not present

## 2015-01-15 DIAGNOSIS — I158 Other secondary hypertension: Secondary | ICD-10-CM

## 2015-01-15 DIAGNOSIS — E038 Other specified hypothyroidism: Secondary | ICD-10-CM

## 2015-01-15 DIAGNOSIS — L52 Erythema nodosum: Secondary | ICD-10-CM

## 2015-01-15 DIAGNOSIS — E063 Autoimmune thyroiditis: Secondary | ICD-10-CM

## 2015-01-15 DIAGNOSIS — E669 Obesity, unspecified: Secondary | ICD-10-CM | POA: Diagnosis not present

## 2015-01-15 LAB — CBC WITH DIFFERENTIAL/PLATELET
BASOS PCT: 1 % (ref 0–1)
Basophils Absolute: 0.1 10*3/uL (ref 0.0–0.1)
Eosinophils Absolute: 0.3 10*3/uL (ref 0.0–0.7)
Eosinophils Relative: 3 % (ref 0–5)
HEMATOCRIT: 38.5 % (ref 36.0–46.0)
HEMOGLOBIN: 12.8 g/dL (ref 12.0–15.0)
LYMPHS ABS: 2.3 10*3/uL (ref 0.7–4.0)
LYMPHS PCT: 26 % (ref 12–46)
MCH: 27.2 pg (ref 26.0–34.0)
MCHC: 33.2 g/dL (ref 30.0–36.0)
MCV: 81.9 fL (ref 78.0–100.0)
MONO ABS: 0.5 10*3/uL (ref 0.1–1.0)
MONOS PCT: 6 % (ref 3–12)
MPV: 10.5 fL (ref 8.6–12.4)
NEUTROS ABS: 5.7 10*3/uL (ref 1.7–7.7)
Neutrophils Relative %: 64 % (ref 43–77)
Platelets: 346 10*3/uL (ref 150–400)
RBC: 4.7 MIL/uL (ref 3.87–5.11)
RDW: 15.3 % (ref 11.5–15.5)
WBC: 8.9 10*3/uL (ref 4.0–10.5)

## 2015-01-15 LAB — COMPREHENSIVE METABOLIC PANEL
ALT: 25 U/L (ref 6–29)
AST: 13 U/L (ref 10–30)
Albumin: 4.2 g/dL (ref 3.6–5.1)
Alkaline Phosphatase: 37 U/L (ref 33–115)
BILIRUBIN TOTAL: 0.4 mg/dL (ref 0.2–1.2)
BUN: 8 mg/dL (ref 7–25)
CALCIUM: 8.9 mg/dL (ref 8.6–10.2)
CO2: 19 mmol/L — AB (ref 20–31)
CREATININE: 0.52 mg/dL (ref 0.50–1.10)
Chloride: 104 mmol/L (ref 98–110)
GLUCOSE: 89 mg/dL (ref 70–99)
Potassium: 4 mmol/L (ref 3.5–5.3)
Sodium: 137 mmol/L (ref 135–146)
Total Protein: 7.1 g/dL (ref 6.1–8.1)

## 2015-01-15 LAB — T4, FREE: Free T4: 1.4 ng/dL (ref 0.80–1.80)

## 2015-01-15 LAB — LIPID PANEL
Cholesterol: 171 mg/dL — ABNORMAL HIGH (ref 125–170)
HDL: 68 mg/dL (ref >=46)
LDL CALC: 89 mg/dL (ref <110)
Total CHOL/HDL Ratio: 2.5 Ratio (ref <=5.0)
Triglycerides: 70 mg/dL (ref <150)
VLDL: 14 mg/dL (ref <30)

## 2015-01-15 LAB — GLUCOSE, POCT (MANUAL RESULT ENTRY): POC Glucose: 93 mg/dl (ref 70–99)

## 2015-01-15 LAB — TSH: TSH: 1.17 u[IU]/mL (ref 0.350–4.500)

## 2015-01-15 LAB — POCT GLYCOSYLATED HEMOGLOBIN (HGB A1C): Hemoglobin A1C: 5.7

## 2015-01-15 MED ORDER — METFORMIN HCL 1000 MG PO TABS: 1000.0000 mg | ORAL_TABLET | Freq: Two times a day (BID) | ORAL | Status: DC

## 2015-01-15 MED ORDER — PANTOPRAZOLE SODIUM 40 MG PO TBEC: 40.0000 mg | DELAYED_RELEASE_TABLET | Freq: Two times a day (BID) | ORAL | Status: DC

## 2015-01-15 MED ORDER — EZETIMIBE 10 MG PO TABS: 10.0000 mg | ORAL_TABLET | Freq: Every day | ORAL | Status: DC

## 2015-01-15 MED ORDER — SYNTHROID 125 MCG PO TABS: 125.0000 ug | ORAL_TABLET | Freq: Every day | ORAL | Status: DC

## 2015-01-15 NOTE — Patient Instructions (Addendum)
Please go across the street to get your labs done. Blood work is to be done at [facility]. This is located one block away at [address]   We will let you know what your labs are to determine if we need to change your synthroid dose or add back vitamin D.   Once your boot if off, continue to work out and make healthy eating choices.

## 2015-01-15 NOTE — Progress Notes (Signed)
Subjective:  Subjective Patient Name: Valerie Marquez Date of Birth: [birth year]  MRN: [id number]  Valerie Marquez  presents to the office today for follow-up evaluation and management of her obesity, acanthosis, goiter, hypothyroidism, thyroiditis, hyperlipidemia, dyspepsia, prediabetes, hypertension, and abnormal liver function tests.  HISTORY OF PRESENT ILLNESS:   Valerie Marquez is a 19 y.o. Caucasian female   Valerie Marquez was accompanied by her self  1. Valerie Marquez was first referred to our clinic on 05/15/04 by her pediatrician, Dr. Aggie HackerBrian Sumner, for evaluation and management of obesity, hyperlipidemia, GERD, abnormal TSH, hunger pains, and asthma.  [age] was 329-1/20 years of age.  She was a preterm infant from a pregnancy complicated by gestational diabetes. She had acanthosis apparent prior to her first visit.  She began to develop  breasts at age 89. She also developed axillary hair and pubic hair at the same time. Family history was positive for obesity and type 2 diabetes on both sides of the family. There was no known thyroid disease. Lab data from 04/23/04 showed a hemoglobin A1c of 5.4%. CMP was normal. TSH was 3.53. T4 was 6.8. Cholesterol was 199. Laboratory data on 05/16/04 showed a TSH of 3.996, free T4 1.14, and free T3 of 4.0. FSH was 0.7. Her LH was less than 0.1. Her testosterone was 37.96. Her estradiol was less than 10. DHEAS was 56. Her androstenedione was 0.4. Her fasting glucose was 91. Her fasting insulin was 38, which placed her in the hyperinsulinemia range. Her 24-hour urine free cortisol value was 7.7 (normal less than 37). She did not have any evidence for Cushing's disease. It appeared that she had the misfortune to inherit genetics for obesity and type 2 diabetes from both sides of her family. Part of the cause of her weight gain has been episodic treatment with steroids for her asthma. In late 2006, when her TSH values remain elevated, we started her on Synthroid, 25 mcg per day. Since then,  we have gradually increased her Synthroid dose to 125 mcg per day. Because her dyspepsia was a major stimulus for weight gain, we started treating her with Prevacid back in 2007. She's continued on her metformin as well. In 2009 we started her on Zetia, 10 mg per day, which resulted improvement in her cholesterol, LDL, and triglycerides per.    2. The patient's last [facility] visit was on 05/24/14. In the interim, she has been generally healthy. She is currently where a boot on her right due to a stress fracture she obtained after she was playing on the beach during Thanksgiving. She states this fracture is number "too many to count". She states she continues to take synthroid, zetia, and metformin and pantoprazole 40 mg BID daily without missing any doses. She states she thinks the pantoprazole is helping her abdominal symptoms a great deal and likes it better than the ranitidine. She states she knows that her weight has gone up and she is not surprised due to being in the boot, previously being on steroids and her diet. She states that previous to the boot she was in the gym, everyday. She has numerous friends who are all softball players and 2-3 times a week she would go to the batting cages with them. She also got a new puppy in January who keeps her pretty active who currently states at home with her mother but will be coming back to school with her in the fall.   Since last visit, she was hospitlized in October for a one-week history of  fevers and increasing erythema nodosum with associated pain on her bilateral legs. She was placed on a steroid taper due to flare up. She was on a sliding scale during stay in the hospital but resumed metformin on discharge. Patient was also slightly anemic at 10.9, baseline (11). Hbg A1C was 6 in the hospital. Patient states she finished steroid taper, 2 weeks. She saw her rheumatologist yesterday in [place] and gave her the "all clear". She is now on Humira for psoriatic  arthriis, every 2 weeks. She states she does not have an official diagnosis of lupus.    She states that the college food and the cafeteria food "is the worse food ever" and she is glad to be at home now for break where she is getting regular meals. She doesn't snack at school and she doesn't have time for breakfast as she has to work on campus, in admissions prior to classes starting.   Patient last asw neurologist (beginning of October) for post concussion syndrome where she had her 13th concussion in March. At this visit she states her short acting ritalin was changed to long acting ritalin and nortriptyline was changed to taznidine. These changes seemed to have been helping. Her neurologist is based in [place], closer to her school.   Patient is excited about upcoming mission trip to [place] during the holiday break.   3. Pertinent Review of Systems:  Constitutional: The patient feels "great". The patient seems healthy and happy.  Head and Eyes: denies any headaches. Vision seems to be good. There are no recognized eye problems. Wears glasses. Neck: The patient has no complaints of anterior neck swelling, soreness, tenderness, pressure, discomfort, or difficulty swallowing.   Heart: Heart rate increases with exercise or other physical activity. The patient has no complaints of palpitations, irregular heart beats, chest pain, or chest pressure.   Gastrointestinal: Bowel movents seem normal. The patient has no complaints of excessive hunger, acid reflux, upset stomach, stomach aches or pains, diarrhea, or constipation. She gets full really about halfway through me  Legs: Muscle mass and strength seem normal. There are no complaints of numbness, tingling, burning, or pain. No edema is noted.  Feet: There are no obvious foot problems. There are no complaints of numbness, tingling, burning, or pain. No edema is noted.  Neurologic: There are no recognized problems with muscle movement and strength,  sensation, or coordination. GYN/GU: periods are normalizing, last occurred was 2 weeks ago- IUD in place  PAST MEDICAL, FAMILY, AND SOCIAL HISTORY  Past Medical History  Diagnosis Date  . Gastroesophageal reflux   . Obesity   . Acanthosis   . Abnormal thyroid function test   . Combined hyperlipidemia   . Dyspepsia   . Prediabetes   . Hypertension   . Abnormal liver function tests   . Asthma   . Diabetes mellitus without complication (HCC)   . Erythema nodosum   . Iron deficiency anemia   . Allergic rhinitis   . Diabetes mellitus (HCC)   . Eczema   . Goiter     hypothyroidsm--hashimoto's  . Lactose intolerance     Family History  Problem Relation Age of Onset  . Cholelithiasis Maternal Grandmother   . Diabetes Maternal Grandmother   . Cervical cancer Maternal Grandmother   . Stroke Maternal Grandmother   . Diabetes Mother   . Hyperlipidemia Father   . Hypertension Father   . Diabetes Father   . Colon cancer Father   . Cancer Maternal Grandfather   .  Diabetes Maternal Grandfather   . Stroke Maternal Grandfather   . Thyroid disease Neg Hx   . Diabetes Paternal Grandmother   . Diabetes Paternal Grandfather      Current outpatient prescriptions:  .  Adalimumab (HUMIRA [place]), Inject into the skin., Disp: , Rfl:  .  B Complex-C (B-COMPLEX WITH VITAMIN C) tablet, Take 1 tablet by mouth 2 (two) times daily., Disp: , Rfl:  .  calcium-vitamin D (CALCIUM 500/D) 500-200 MG-UNIT per tablet, 1 tablet., Disp: , Rfl:  .  desonide (DESOWEN) 0.05 % cream, Apply topically 2 (two) times daily. Apply topically 2 times daily. As needed for dry skin, Disp: 60 g, Rfl: 6 .  ezetimibe (ZETIA) 10 MG tablet, Take 1 tablet (10 mg total) by mouth daily., Disp: 90 tablet, Rfl: 6 .  levocetirizine (XYZAL) 5 MG tablet, Take 1 tablet (5 mg total) by mouth every evening., Disp: 90 tablet, Rfl: 3 .  metFORMIN (GLUCOPHAGE) 1000 MG tablet, Take 1 tablet (1,000 mg total) by mouth 2 (two) times daily  with a meal., Disp: 180 tablet, Rfl: 6 .  methylphenidate (RITALIN LA) 20 MG 24 hr capsule, Take 20 mg by mouth every morning., Disp: , Rfl:  .  Multiple Vitamin (MULTI-VITAMINS) TABS, 1 tablet., Disp: , Rfl:  .  nortriptyline (PAMELOR) 10 MG capsule, Take 20 mg by mouth at bedtime. Patient takes 2-10mg  pills at bed time, Disp: , Rfl:  .  NOVOFINE 32G X 6 MM MISC, USE 1 NEEDLE DAILY, Disp: 100 each, Rfl: 0 .  Olopatadine HCl (PATADAY OP), Apply 2 drops to eye daily. Used for allergy/dry eye, Disp: , Rfl:  .  pantoprazole (PROTONIX) 40 MG tablet, Take 1 tablet (40 mg total) by mouth 2 (two) times daily., Disp: 60 tablet, Rfl: 6 .  SYNTHROID 125 MCG tablet, Take 1 tablet (125 mcg total) by mouth daily., Disp: 90 tablet, Rfl: 3 .  tizanidine (ZANAFLEX) 6 MG capsule, Take 6 mg by mouth 3 (three) times daily., Disp: , Rfl:  .  traMADol (ULTRAM) 50 MG tablet, Reported on 01/15/2015, Disp: , Rfl: 0 .  acetaminophen (TYLENOL) 500 MG tablet, Take 1,000 mg by mouth every 4 (four) hours as needed for fever (alternating with ibuprofen). Reported on 01/15/2015, Disp: , Rfl:  .  albuterol (PROVENTIL) (2.5 MG/3ML) 0.083% nebulizer solution, Take 2.5 mg by nebulization every 6 (six) hours as needed for wheezing. Reported on 01/15/2015, Disp: , Rfl:  .  EPIPEN 2-PAK 0.3 MG/0.3ML DEVI, Inject 0.3 mg into the muscle once as needed (for severe allergic reation). Reported on 01/15/2015, Disp: , Rfl:  .  ergocalciferol (VITAMIN D2) 50000 UNITS capsule, Take 1 capsule once a week for 12 weeks (Patient not taking: Reported on 01/15/2015), Disp: 12 capsule, Rfl: 2 .  ibuprofen (ADVIL,MOTRIN) 200 MG tablet, Take 600 mg by mouth every 6 (six) hours as needed for pain or fever (alternating with tlyenol). Reported on 01/15/2015, Disp: , Rfl:  .  levalbuterol (XOPENEX HFA) 45 MCG/ACT inhaler, Inhale 1-2 puffs into the lungs every 6 (six) hours as needed for wheezing or shortness of breath. Reported on 01/15/2015, Disp: , Rfl:  .   predniSONE (DELTASONE) 10 MG tablet, Take 4 tablets daily for 7 days, then Take 3 tablets daily for 7 days, then Take 2 tablets daily for 7 days, then Take 1 tablet daily for 7 days. (Patient not taking: Reported on 01/15/2015), Disp: 64 tablet, Rfl: 0 .  PROAIR HFA 108 (90 BASE) MCG/ACT inhaler, USE 2  INHALATIONS EVERY 4 TO 6 HOURS AS NEEDED FOR COUGH OR WHEEZE, CAN USE 2 INHALATIONS 10 TO 20 MINUTES PRIOR TO EXERCISE (Patient not taking: Reported on 01/15/2015), Disp: 25.5 g, Rfl: 0 .  [DISCONTINUED] bethanechol (URECHOLINE) 5 MG tablet, Take 1 tablet (5 mg total) by mouth 3 (three) times daily., Disp: 270 tablet, Rfl: 3  Allergies as of 01/15/2015 - Review Complete 11/13/2014  Allergen Reaction Noted  . Corn-containing products  05/09/2012  . Peanut-containing drug products  05/09/2012  . Amoxicillin Nausea And Vomiting and Rash 06/30/2010  . Other Rash 04/21/2013     reports that she has never smoked. She has never used smokeless tobacco. She reports that she does not drink alcohol or use illicit drugs. Pediatric History  Patient Guardian Status  . Mother:  Devory, Mckinzie   Other Topics Concern  . Not on file   Social History Narrative   Dad deceased 02/09/2022.    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   Primary Care Provider: Beverely Low, MD  ROS: There are no other significant problems involving Rashon's other body systems.    Objective:  Objective Vital Signs:  BP 131/68 mmHg  Wt 245 lb 9.6 oz (111.403 kg)    Ht Readings from Last 3 Encounters:  11/14/14 5\' 7"  (1.702 m) (86 %*, Z = 1.06)  01/05/14 5\' 7"  (1.702 m) (86 %*, Z = 1.07)  04/21/13 5\' 6"  (1.676 m) (75 %*, Z = 0.69)   * Growth percentiles are based on CDC 2-20 Years data.   Wt Readings from Last 3 Encounters:  01/15/15 245 lb 9.6 oz (111.403 kg)  11/15/14 231 lb 8.1 oz (105.01 kg) (99 %*, Z = 2.34)  05/24/14 222 lb (100.699 kg) (99 %*,  Z = 2.23)   * Growth percentiles are based on CDC 2-20 Years data.   HC Readings from Last 3 Encounters:  No data found for [facility]   There is no height on file to calculate BSA. Normalized stature-for-age data available only for age 59 to 20 years. Normalized weight-for-age data available only for age 59 to 20 years.    PHYSICAL EXAM:  Constitutional: The patient appears healthy and well nourished. The patient's height and weight are advanced for age.  Head: The head is normocephalic. Face: The face appears normal  Eyes: The eyes appear to be normally formed and spaced. Gaze is conjugate. There is no obvious arcus or proptosis. Moisture appears normal. Ears: The ears are normally placed and appear externally normal.  Mouth: The oropharynx and tongue appear normal except for yellow film on tongue. Dentition appears to be normal for age. Oral moisture is normal. Neck: The neck appears to be visibly normal. The thyroid gland is normal in size. The consistency of the thyroid gland is normal. The thyroid gland is not tender to palpation. Lungs: The lungs are clear to auscultation. Air movement is good. Heart: Heart rate and rhythm are regular. Heart sounds S1 and S2 are normal. I did not appreciate any pathologic cardiac murmurs. Abdomen: The abdomen appears to be large in size for the patient's age. Bowel sounds are normal. There is no obvious hepatomegaly, splenomegaly, or other mass effect. +stretch marks Arms: Muscle size and bulk are normal for age. Hands: There is no obvious tremor. Phalangeal and metacarpophalangeal joints are normal. Legs: Muscles appear normal for age. No edema is present. Feet: Feet are normally formed. Boot present on right foot. GYN/GU: deferred  LAB DATA:  Results for orders placed or performed in visit on 01/15/15  POCT Glucose (CBG)  Result Value Ref Range   POC Glucose 93 70 - 99 mg/dl  POCT HgB Z6X  Result Value Ref Range   Hemoglobin A1C 5.7         Assessment and Plan:  Assessment ASSESSMENT:  1. Hypothyroidism- clinically euthyroid. Goiter not present today. Labs today due to not being done at last visit. 2. Type 2 diabetes- A1C down to 5.7% today from 6.0 in the hospital. Continues on metformin and continues to increase exercise once patient if off the boot. Having a harder time with healthier choices due to schedule and cafe options. Patient also has a modest increase in weight since last visit likely due to steroid usage.   3. Hyperlipidemia- on Zetia.  4. Erythema Nodosum- in remission. Managed by rheum. Also managing psoriatic arthritis, now on Humira.   PLAN:  1. Diagnostic: A1C as above. TFTs, cbc and vit d ordered today. Along with CMP and lipids. Will wait to restart vitamin d based on level. Will also wait to determine thyroid levels to see if any changes need to be made with synthroid dose.  2. Therapeutic: Continue synthroid, zetia, and metformin along with pantoprazole 40 mg BID as seems to be helping nausea and gastric discomfort. Provided refills on all the above medications. 3. Patient education: Patient appears to be doing well overall with interdisciplinary management from our team, neurology and rheumatology. Should continue care with these specialists. Discussed improving A1C and how this is the focus and not weight. Discussed ways to continue to improve this number since last visit. 4. Follow-up: 6 months with Dr. Nancie Neas, Freida Busman, MD   LOS Level of Service: This visit lasted in excess of 25 minutes. More than 50% of the visit was devoted to counseling.  I have seen and examined this patient along with the resident and agree with the assessment and plan as above.  Valerie Marquez has recently completed course of high dose (70mg ) Prednisone for flare of Erythema Nodosum which had resulted in hospitalization. She currently is wearing a boot for stress fracture associated with high steroid use. She has lost  count of how many fractures she has had. She is not currently taking her Vit D. Will repeat level today. She continues on therapy for her type 2 diabetes (steroid related), hyperlipidemia, and hypertension (steroid related). She is taking pantoprazole which has helped to protect her stomach from the steroids as well as help her dyspepsia associated with insulin resistance. She is about to leave for [place] on a mission trip. Labs today. Follow up 6 months.  Cammie Sickle, MD

## 2015-01-16 ENCOUNTER — Telehealth: Payer: Self-pay | Admitting: Pediatric Endocrinology

## 2015-01-16 ENCOUNTER — Ambulatory Visit (INDEPENDENT_AMBULATORY_CARE_PROVIDER_SITE_OTHER): Payer: 59 | Admitting: Allergy and Immunology

## 2015-01-16 ENCOUNTER — Encounter: Payer: Self-pay | Admitting: Allergy and Immunology

## 2015-01-16 VITALS — BP 122/92 | HR 84 | Resp 14 | Ht 66.14 in | Wt 244.5 lb

## 2015-01-16 DIAGNOSIS — J4531 Mild persistent asthma with (acute) exacerbation: Secondary | ICD-10-CM

## 2015-01-16 DIAGNOSIS — E559 Vitamin D deficiency, unspecified: Secondary | ICD-10-CM

## 2015-01-16 DIAGNOSIS — J4520 Mild intermittent asthma, uncomplicated: Secondary | ICD-10-CM | POA: Diagnosis not present

## 2015-01-16 DIAGNOSIS — R1013 Epigastric pain: Secondary | ICD-10-CM | POA: Diagnosis not present

## 2015-01-16 DIAGNOSIS — L309 Dermatitis, unspecified: Secondary | ICD-10-CM | POA: Diagnosis not present

## 2015-01-16 LAB — VITAMIN D 25 HYDROXY (VIT D DEFICIENCY, FRACTURES): Vit D, 25-Hydroxy: 20 ng/mL — ABNORMAL LOW (ref 30–100)

## 2015-01-16 MED ORDER — PANTOPRAZOLE SODIUM 40 MG PO TBEC: 40.0000 mg | DELAYED_RELEASE_TABLET | Freq: Two times a day (BID) | ORAL | Status: DC

## 2015-01-16 MED ORDER — DESONIDE 0.05 % EX CREA: TOPICAL_CREAM | CUTANEOUS | Status: DC

## 2015-01-16 MED ORDER — LEVALBUTEROL TARTRATE 45 MCG/ACT IN AERO: INHALATION_SPRAY | RESPIRATORY_TRACT | Status: DC

## 2015-01-16 MED ORDER — EPINEPHRINE 0.3 MG/0.3ML IJ SOAJ: INTRAMUSCULAR | Status: DC

## 2015-01-16 MED ORDER — LEVOCETIRIZINE DIHYDROCHLORIDE 5 MG PO TABS: 5.0000 mg | ORAL_TABLET | Freq: Every evening | ORAL | Status: DC

## 2015-01-16 MED ORDER — ERGOCALCIFEROL 1.25 MG (50000 UT) PO CAPS: ORAL_CAPSULE | ORAL | Status: DC

## 2015-01-16 MED ORDER — OLOPATADINE HCL 0.2 % OP SOLN: OPHTHALMIC | Status: DC

## 2015-01-16 MED ORDER — MOMETASONE FUROATE 200 MCG/ACT IN AERO: 2.0000 | INHALATION_SPRAY | Freq: Two times a day (BID) | RESPIRATORY_TRACT | Status: DC

## 2015-01-16 NOTE — Telephone Encounter (Signed)
Vit d level still low. Spoke with [name]. Will reorder ergocalciferol. Repeat vit D level in 3 months. Other labs essentially normal.  Valerie Marquez Valerie Marquez

## 2015-01-16 NOTE — Progress Notes (Signed)
[facility] [place]  Follow-up Note  Refering Provider: Aggie HackerSumner, Brian, MD Primary Provider: Beverely LowSUMNER,Valerie A, MD  Subjective:   Valerie KanskySamantha M Marquez is a 19 y.o. female who [facility] the [facility] in re-evaluation of the following:  HPI Comments:   Valerie Marquez returns to this clinic on 16 January 2015 in reevaluation of her asthma, allergic rhinitis, LPR. She has done very well from a respiratory standpoint until approximately one month ago. At that point in time she started to develop chest tightness and some wheezing and using a bronchodilator twice a day. She does respond to a bronchodilator. There is no obvious trigger giving rise to this issue. She is not had any accompanying respiratory symptoms involving her nose. Her reflux is been doing very well while using pantoprazole twice a day. She's not had any fever or ugly nasal discharge or ugly sputum production. She is now on Humira for her psoriatic arthritis and her intermittent erythema nodosum still appears to flare up about 1 time per year with a recent flareup occurring this fall. Interestingly, her flareup occurred within 1 week of using Humira.   Current Outpatient Prescriptions on File Prior to Visit  Medication Sig Dispense Refill  . acetaminophen (TYLENOL) 500 MG tablet Take 1,000 mg by mouth every 4 (four) hours as needed for fever (alternating with ibuprofen). Reported on 01/15/2015    . Adalimumab (HUMIRA [place]) Inject into the skin.    Marland Kitchen. albuterol (PROVENTIL) (2.5 MG/3ML) 0.083% nebulizer solution Take 2.5 mg by nebulization every 6 (six) hours as needed for wheezing. Reported on 01/15/2015    . B Complex-C (B-COMPLEX WITH VITAMIN C) tablet Take 1 tablet by mouth 2 (two) times daily.    . calcium-vitamin D (CALCIUM 500/D) 500-200 MG-UNIT per tablet 1 tablet.    Marland Kitchen. desonide (DESOWEN) 0.05 % cream Apply topically 2 (two) times daily. Apply topically 2 times daily. As needed  for dry skin 60 g 6  . EPIPEN 2-PAK 0.3 MG/0.3ML DEVI Inject 0.3 mg into the muscle once as needed (for severe allergic reation). Reported on 01/16/2015    . ezetimibe (ZETIA) 10 MG tablet Take 1 tablet (10 mg total) by mouth daily. 90 tablet 6  . ibuprofen (ADVIL,MOTRIN) 200 MG tablet Take 600 mg by mouth every 6 (six) hours as needed for pain or fever (alternating with tlyenol). Reported on 01/15/2015    . levocetirizine (XYZAL) 5 MG tablet Take 1 tablet (5 mg total) by mouth every evening. 90 tablet 3  . metFORMIN (GLUCOPHAGE) 1000 MG tablet Take 1 tablet (1,000 mg total) by mouth 2 (two) times daily with a meal. 180 tablet 6  . methylphenidate (RITALIN LA) 20 MG 24 hr capsule Take 20 mg by mouth every morning.    . Multiple Vitamin (MULTI-VITAMINS) TABS 1 tablet.    Marland Kitchen. NOVOFINE 32G X 6 MM MISC USE 1 NEEDLE DAILY 100 each 0  . Olopatadine HCl (PATADAY OP) Apply 2 drops to eye daily. Used for allergy/dry eye    . pantoprazole (PROTONIX) 40 MG tablet Take 1 tablet (40 mg total) by mouth 2 (two) times daily. 60 tablet 6  . SYNTHROID 125 MCG tablet Take 1 tablet (125 mcg total) by mouth daily. 90 tablet 3  . tizanidine (ZANAFLEX) 6 MG capsule Take 6 mg by mouth 3 (three) times daily.    . traMADol (ULTRAM) 50 MG tablet Reported on 01/15/2015  0  . ergocalciferol (VITAMIN D2) 50000  UNITS capsule Take 1 capsule once a week for 12 weeks (Patient not taking: Reported on 01/15/2015) 12 capsule 2  . nortriptyline (PAMELOR) 10 MG capsule Take 20 mg by mouth at bedtime. Reported on 01/16/2015    . predniSONE (DELTASONE) 10 MG tablet Take 4 tablets daily for 7 days, then Take 3 tablets daily for 7 days, then Take 2 tablets daily for 7 days, then Take 1 tablet daily for 7 days. (Patient not taking: Reported on 01/15/2015) 64 tablet 0  . PROAIR HFA 108 (90 BASE) MCG/ACT inhaler USE 2 INHALATIONS EVERY 4 TO 6 HOURS AS NEEDED FOR COUGH OR WHEEZE, CAN USE 2 INHALATIONS 10 TO 20 MINUTES PRIOR TO EXERCISE (Patient  not taking: Reported on 01/15/2015) 25.5 g 0  . [DISCONTINUED] bethanechol (URECHOLINE) 5 MG tablet Take 1 tablet (5 mg total) by mouth 3 (three) times daily. 270 tablet 3   No current facility-administered medications on file prior to visit.    Meds ordered this encounter  Medications  . levalbuterol (XOPENEX HFA) 45 MCG/ACT inhaler    Sig: Inhale two puffs every four to six hours as needed for cough or wheeze.    Dispense:  3 Inhaler    Refill:  0    Past Medical History  Diagnosis Date  . Gastroesophageal reflux   . Obesity   . Acanthosis   . Abnormal thyroid function test   . Combined hyperlipidemia   . Dyspepsia   . Prediabetes   . Hypertension   . Abnormal liver function tests   . Asthma   . Diabetes mellitus without complication (HCC)   . Erythema nodosum   . Iron deficiency anemia   . Allergic rhinitis   . Diabetes mellitus (HCC)   . Eczema   . Goiter     hypothyroidsm--hashimoto's  . Lactose intolerance     Past Surgical History  Procedure Laterality Date  . Tympanostomy tube placement    . Tonsillectomy and adenoidectomy    . Sinuses    . Ankle surgery Right     --bone spur  . Wisdom tooth extraction    . Knee surgery Left 2014    Allergies  Allergen Reactions  . Corn-Containing Products   . Peanut-Containing Drug Products   . Amoxicillin Nausea And Vomiting and Rash    No reaction listed  . Other Rash    Corn, soy, nuts (all types), cats, dogs    Review of Systems  Constitutional: Negative.   HENT: Negative.   Eyes: Negative.   Respiratory: Positive for cough, shortness of breath and wheezing.   Cardiovascular: Negative.   Gastrointestinal: Negative.   Musculoskeletal: Positive for arthralgias (Psoriatic arthritis treated with Humira).  Skin: Negative.   Neurological: Negative.   Hematological: Negative.      Objective:   Filed Vitals:   01/16/15 1108  BP: 122/92  Pulse: 84  Resp: 14   Height: 5' 6.14" (168 cm)  Weight: 244  lb 7.8 oz (110.9 kg)   Physical Exam  Diagnostics:    Spirometry was performed and demonstrated an FEV1 of 3.55 at 105 % of predicted.  The patient had an Asthma Control Test with the following results:  .    Assessment and Plan:   1. Asthma, mild intermittent, uncomplicated   2. Dyspepsia   3. Eczema of both hands   4. Asthma, mild persistent, with acute exacerbation      1. Start Asmanex 200 HFA 2 inhalations twice a day with spacer  2. Continue Xopenex HFA 2 puffs every 4-6 hours if needed  3. Continue Protonix 40 two times per day  4. Continue antihistamine if needed  5. Continue desonide if needed  6. Return to clinic in 1 month or earlier if problem  I will start Martie on Asmanex 800 g daily and see her back in this clinic in a possibly 4 weeks and make a decision about how to proceed pending her response. I suspect that this will probably get rid of the lingering inflammation that's inside her chest and certainly she continues to have problems in the face of this therapy we will proceed with further evaluation and treatment. We'll continue to use therapy against reflux and for her hand eczema.   Laurette Schimke, MD [facility]

## 2015-01-16 NOTE — Patient Instructions (Addendum)
  1. Start Asmanex 200 HFA 2 inhalations twice a day with spacer  2. Continue Xopenex HFA 2 puffs every 4-6 hours if needed  3. Continue Protonix 40 two times per day  4. Continue antihistamine if needed  5. Continue desonide if needed  6. Return to clinic in 1 month or earlier if problem

## 2015-01-18 ENCOUNTER — Encounter: Payer: Self-pay | Admitting: Certified Nurse Midwife

## 2015-01-18 ENCOUNTER — Ambulatory Visit (INDEPENDENT_AMBULATORY_CARE_PROVIDER_SITE_OTHER): Payer: 59 | Admitting: Certified Nurse Midwife

## 2015-01-18 VITALS — BP 110/72 | HR 68 | Resp 16 | Ht 66.75 in | Wt 244.0 lb

## 2015-01-18 DIAGNOSIS — Z01419 Encounter for gynecological examination (general) (routine) without abnormal findings: Secondary | ICD-10-CM | POA: Diagnosis not present

## 2015-01-18 DIAGNOSIS — Z0000 Encounter for general adult medical examination without abnormal findings: Secondary | ICD-10-CM | POA: Diagnosis not present

## 2015-01-18 NOTE — Patient Instructions (Signed)
General topics  Next pap or exam is  due in 1 year Take a Women's multivitamin Take 1200 mg. of calcium daily - prefer dietary If any concerns in interim to call back  Breast Self-Awareness Practicing breast self-awareness may pick up problems early, prevent significant medical complications, and possibly save your life. By practicing breast self-awareness, you can become familiar with how your breasts look and feel and if your breasts are changing. This allows you to notice changes early. It can also offer you some reassurance that your breast health is good. One way to learn what is normal for your breasts and whether your breasts are changing is to do a breast self-exam. If you find a lump or something that was not present in the past, it is best to contact your caregiver right away. Other findings that should be evaluated by your caregiver include nipple discharge, especially if it is bloody; skin changes or reddening; areas where the skin seems to be pulled in (retracted); or new lumps and bumps. Breast pain is seldom associated with cancer (malignancy), but should also be evaluated by a caregiver. BREAST SELF-EXAM The best time to examine your breasts is 5 7 days after your menstrual period is over.  ExitCare Patient Information 2013 [employer].   Exercise to Stay Healthy Exercise helps you become and stay healthy. EXERCISE IDEAS AND TIPS Choose exercises that:  You enjoy.  Fit into your day. You do not need to exercise really hard to be healthy. You can do exercises at a slow or medium level and stay healthy. You can:  Stretch before and after working out.  Try yoga, Pilates, or tai chi.  Lift weights.  Walk fast, swim, jog, run, climb stairs, bicycle, dance, or rollerskate.  Take aerobic classes. Exercises that burn about 150 calories:  Running 1  miles in 15 minutes.  Playing volleyball for 45 to 60 minutes.  Washing and waxing a car for 45 to 60  minutes.  Playing touch football for 45 minutes.  Walking 1  miles in 35 minutes.  Pushing a stroller 1  miles in 30 minutes.  Playing basketball for 30 minutes.  Raking leaves for 30 minutes.  Bicycling 5 miles in 30 minutes.  Walking 2 miles in 30 minutes.  Dancing for 30 minutes.  Shoveling snow for 15 minutes.  Swimming laps for 20 minutes.  Walking up stairs for 15 minutes.  Bicycling 4 miles in 15 minutes.  Gardening for 30 to 45 minutes.  Jumping rope for 15 minutes.  Washing windows or floors for 45 to 60 minutes. Document Released: 02/14/2010 Document Revised: 04/06/2011 Document Reviewed: 02/14/2010 ExitCare Patient Information 2013 ExitCare[employer].   Other topics ( that may be useful information):    Sexually Transmitted Disease Sexually transmitted disease (STD) refers to any infection that is passed from person to person during sexual activity. This may happen by way of saliva, semen, blood, vaginal mucus, or urine. Common STDs include:  Gonorrhea.  Chlamydia.  Syphilis.  HIV/AIDS.  Genital herpes.  Hepatitis B and C.  Trichomonas.  Human papillomavirus (HPV).  Pubic lice. CAUSES  An STD may be spread by bacteria, virus, or parasite. A person can get an STD by:  Sexual intercourse with an infected person.  Sharing sex toys with an infected person.  Sharing needles with an infected person.  Having intimate contact with the genitals, mouth, or rectal areas of an infected person. SYMPTOMS  Some people may not have any symptoms, but   they can still pass the infection to others. Different STDs have different symptoms. Symptoms include:  Painful or bloody urination.  Pain in the pelvis, abdomen, vagina, anus, throat, or eyes.  Skin rash, itching, irritation, growths, or sores (lesions). These usually occur in the genital or anal area.  Abnormal vaginal discharge.  Penile discharge in men.  Soft, flesh-colored skin growths in the  genital or anal area.  Fever.  Pain or bleeding during sexual intercourse.  Swollen glands in the groin area.  Yellow skin and eyes (jaundice). This is seen with hepatitis. DIAGNOSIS  To make a diagnosis, your caregiver may:  Take a medical history.  Perform a physical exam.  Take a specimen (culture) to be examined.  Examine a sample of discharge under a microscope.  Perform blood test TREATMENT   Chlamydia, gonorrhea, trichomonas, and syphilis can be cured with antibiotic medicine.  Genital herpes, hepatitis, and HIV can be treated, but not cured, with prescribed medicines. The medicines will lessen the symptoms.  Genital warts from HPV can be treated with medicine or by freezing, burning (electrocautery), or surgery. Warts may come back.  HPV is a virus and cannot be cured with medicine or surgery.However, abnormal areas may be followed very closely by your caregiver and may be removed from the cervix, vagina, or vulva through office procedures or surgery. If your diagnosis is confirmed, your recent sexual partners need treatment. This is true even if they are symptom-free or have a negative culture or evaluation. They should not have sex until their caregiver says it is okay. HOME CARE INSTRUCTIONS  All sexual partners should be informed, tested, and treated for all STDs.  Take your antibiotics as directed. Finish them even if you start to feel better.  Only take over-the-counter or prescription medicines for pain, discomfort, or fever as directed by your caregiver.  Rest.  Eat a balanced diet and drink enough fluids to keep your urine clear or pale yellow.  Do not have sex until treatment is completed and you have followed up with your caregiver. STDs should be checked after treatment.  Keep all follow-up appointments, Pap tests, and blood tests as directed by your caregiver.  Only use latex condoms and water-soluble lubricants during sexual activity. Do not use  petroleum jelly or oils.  Avoid alcohol and illegal drugs.  Get vaccinated for HPV and hepatitis. If you have not received these vaccines in the past, talk to your caregiver about whether one or both might be right for you.  Avoid risky sex practices that can break the skin. The only way to avoid getting an STD is to avoid all sexual activity.Latex condoms and dental dams (for oral sex) will help lessen the risk of getting an STD, but will not completely eliminate the risk. SEEK MEDICAL CARE IF:   You have a fever.  You have any new or worsening symptoms. Document Released: 04/04/2002 Document Revised: 04/06/2011 Document Reviewed: 04/11/2010 [facility] Patient Information 2013 [place].    Domestic Abuse You are being battered or abused if someone close to you hits, pushes, or physically hurts you in any way. You also are being abused if you are forced into activities. You are being sexually abused if you are forced to have sexual contact of any kind. You are being emotionally abused if you are made to feel worthless or if you are constantly threatened. It is important to remember that help is available. No one has the right to abuse you. PREVENTION OF FURTHER  ABUSE  Learn the warning signs of danger. This varies with situations but may include: the use of alcohol, threats, isolation from friends and family, or forced sexual contact. Leave if you feel that violence is going to occur.  If you are attacked or beaten, report it to the police so the abuse is documented. You do not have to press charges. The police can protect you while you or the attackers are leaving. Get the officer's name and badge number and a copy of the report.  Find someone you can trust and tell them what is happening to you: your caregiver, a nurse, clergy member, close friend or family member. Feeling ashamed is natural, but remember that you have done nothing wrong. No one deserves abuse. Document Released:  01/10/2000 Document Revised: 04/06/2011 Document Reviewed: 03/20/2010 ExitCare Patient Information 2013 [employer]Care[employer].    How Much is Too Much Alcohol? Drinking too much alcohol can cause injury, accidents, and health problems. These types of problems can include:   Car crashes.  Falls.  Family fighting (domestic violence).  Drowning.  Fights.  Injuries.  Burns.  Damage to certain organs.  Having a baby with birth defects. ONE DRINK CAN BE TOO MUCH WHEN YOU ARE:  Working.  Pregnant or breastfeeding.  Taking medicines. Ask your doctor.  Driving or planning to drive. If you or someone you know has a drinking problem, get help from a doctor.  Document Released: 11/08/2008 Document Revised: 04/06/2011 Document Reviewed: 11/08/2008 ExitCare Patient Information 2013 ExitCare[employer].   Smoking Hazards Smoking cigarettes is extremely bad for your health. Tobacco smoke has over 200 known poisons in it. There are over 60 chemicals in tobacco smoke that cause cancer. Some of the chemicals found in cigarette smoke include:   Cyanide.  Benzene.  Formaldehyde.  Methanol (wood alcohol).  Acetylene (fuel used in welding torches).  Ammonia. Cigarette smoke also contains the poisonous gases nitrogen oxide and carbon monoxide.  Cigarette smokers have an increased risk of many serious medical problems and Smoking causes approximately:  90% of all lung cancer deaths in men.  80% of all lung cancer deaths in women.  90% of deaths from chronic obstructive lung disease. Compared with nonsmokers, smoking increases the risk of:  Coronary heart disease by 2 to 4 times.  Stroke by 2 to 4 times.  Men developing lung cancer by 23 times.  Women developing lung cancer by 13 times.  Dying from chronic obstructive lung diseases by 12 times.  . Smoking is the most preventable cause of death and disease in our society.  WHY IS SMOKING ADDICTIVE?  Nicotine is the chemical  agent in tobacco that is capable of causing addiction or dependence.  When you smoke and inhale, nicotine is absorbed rapidly into the bloodstream through your lungs. Nicotine absorbed through the lungs is capable of creating a powerful addiction. Both inhaled and non-inhaled nicotine may be addictive.  Addiction studies of cigarettes and spit tobacco show that addiction to nicotine occurs mainly during the teen years, when young people begin using tobacco products. WHAT ARE THE BENEFITS OF QUITTING?  There are many health benefits to quitting smoking.   Likelihood of developing cancer and heart disease decreases. Health improvements are seen almost immediately.  Blood pressure, pulse rate, and breathing patterns start returning to normal soon after quitting. QUITTING SMOKING   American Lung Association - 1-800-LUNGUSA  American Cancer Society - 1-800-ACS-2345 Document Released: 02/20/2004 Document Revised: 04/06/2011 Document Reviewed: 10/24/2008 ExitCare Patient Information 2013 ExitCare,   LLC.   Stress Management Stress is a state of physical or mental tension that often results from changes in your life or normal routine. Some common causes of stress are:  Death of a loved one.  Injuries or severe illnesses.  Getting fired or changing jobs.  Moving into a new home. Other causes may be:  Sexual problems.  Business or financial losses.  Taking on a large debt.  Regular conflict with someone at home or at work.  Constant tiredness from lack of sleep. It is not just bad things that are stressful. It may be stressful to:  Win the lottery.  Get married.  Buy a new car. The amount of stress that can be easily tolerated varies from person to person. Changes generally cause stress, regardless of the types of change. Too much stress can affect your health. It may lead to physical or emotional problems. Too little stress (boredom) may also become stressful. SUGGESTIONS TO  REDUCE STRESS:  Talk things over with your family and friends. It often is helpful to share your concerns and worries. If you feel your problem is serious, you may want to get help from a professional counselor.  Consider your problems one at a time instead of lumping them all together. Trying to take care of everything at once may seem impossible. List all the things you need to do and then start with the most important one. Set a goal to accomplish 2 or 3 things each day. If you expect to do too many in a single day you will naturally fail, causing you to feel even more stressed.  Do not use alcohol or drugs to relieve stress. Although you may feel better for a short time, they do not remove the problems that caused the stress. They can also be habit forming.  Exercise regularly - at least 3 times per week. Physical exercise can help to relieve that "uptight" feeling and will relax you.  The shortest distance between despair and hope is often a good night's sleep.  Go to bed and get up on time allowing yourself time for appointments without being rushed.  Take a short "time-out" period from any stressful situation that occurs during the day. Close your eyes and take some deep breaths. Starting with the muscles in your face, tense them, hold it for a few seconds, then relax. Repeat this with the muscles in your neck, shoulders, hand, stomach, back and legs.  Take good care of yourself. Eat a balanced diet and get plenty of rest.  Schedule time for having fun. Take a break from your daily routine to relax. HOME CARE INSTRUCTIONS   Call if you feel overwhelmed by your problems and feel you can no longer manage them on your own.  Return immediately if you feel like hurting yourself or someone else. Document Released: 07/08/2000 Document Revised: 04/06/2011 Document Reviewed: 02/28/2007 ExitCare Patient Information 2013 ExitCare, LLC.   

## 2015-01-18 NOTE — Progress Notes (Signed)
Reviewed personally.  M. Suzanne Satya Buttram, MD.  

## 2015-01-18 NOTE — Progress Notes (Signed)
19 y.o. G0P0000 Single  Caucasian Fe here for annual exam. Periods normal, scant with Mirena IUD.Not sexually active all. Sees Endocrine for glucose/hypothyroid management, all stable. Sees Dr Lucie Leather for allergies/asthma, Neurology for Ritalin management, rheumatology for medication management,PCP for aex and other labs. Was Vitamin D deficient and now on Rx. Per PCP. Glucose levels staying normal. Taking mission trip to [place] after Christmas! Has all her meds updated to take with her.  Patient's last menstrual period was 12/27/2014.          Sexually active: No.  The current method of family planning is IUD inserted 03/10/13 Skyla  Exercising: Yes.    cycling & gym Smoker:  no  Health Maintenance: Pap:  none MMG:  none Colonoscopy:  2014 f.u 60yrs BMD:   none TDaP:  2010 Shingles: none Pneumonia: none Hep C and HIV: unsure Labs: pcp Self breast exam: done monthly   reports that she has never smoked. She has never used smokeless tobacco. She reports that she does not drink alcohol or use illicit drugs.  Past Medical History  Diagnosis Date  . Gastroesophageal reflux   . Obesity   . Acanthosis   . Abnormal thyroid function test   . Combined hyperlipidemia   . Dyspepsia   . Prediabetes   . Hypertension   . Abnormal liver function tests   . Asthma   . Diabetes mellitus without complication (HCC)   . Erythema nodosum   . Iron deficiency anemia   . Allergic rhinitis   . Diabetes mellitus (HCC)   . Eczema   . Goiter     hypothyroidsm--hashimoto's  . Lactose intolerance   . Concussion   . Psoriatic arthritis [facility])     Past Surgical History  Procedure Laterality Date  . Tympanostomy tube placement    . Tonsillectomy and adenoidectomy    . Sinuses    . Ankle surgery Right     --bone spur  . Wisdom tooth extraction    . Knee surgery Left 2014    Current Outpatient Prescriptions  Medication Sig Dispense Refill  . acetaminophen (TYLENOL) 500 MG tablet Take 1,000 mg by  mouth every 4 (four) hours as needed for fever (alternating with ibuprofen). Reported on 01/15/2015    . Adalimumab (HUMIRA [place]) Inject into the skin.    Marland Kitchen albuterol (PROVENTIL) (2.5 MG/3ML) 0.083% nebulizer solution Take 2.5 mg by nebulization every 6 (six) hours as needed for wheezing. Reported on 01/15/2015    . B Complex-C (B-COMPLEX WITH VITAMIN C) tablet Take 1 tablet by mouth 2 (two) times daily.    . clindamycin (CLEOCIN T) 1 % lotion APPLY FOR RAZOR BURN ONCE DAILY WHEN NEEDED    . clindamycin (CLEOCIN T) 1 % lotion APPLY FOR RAZOR BURN ONCE DAILY WHEN NEEDED    . clobetasol ointment (TEMOVATE) 0.05 % Apply to hands twice daily for 2 wks, then daily. Please, dispense 3 months supply. Not to face.    . desonide (DESOWEN) 0.05 % cream Apply to affected areas once daily as needed. 60 g 5  . ergocalciferol (VITAMIN D2) 50000 UNITS capsule Take 1 capsule once a week for 12 weeks 12 capsule 2  . ezetimibe (ZETIA) 10 MG tablet Take 1 tablet (10 mg total) by mouth daily. 90 tablet 6  . folic acid (FOLVITE) 1 MG tablet take 1 tablet by mouth once daily EXCEPT THE DAY THE METHOTREXATE IS TAKEN    . ibuprofen (ADVIL,MOTRIN) 200 MG tablet Take 600 mg by mouth  every 6 (six) hours as needed for pain or fever (alternating with tlyenol). Reported on 01/15/2015    . levalbuterol (XOPENEX HFA) 45 MCG/ACT inhaler Inhale two puffs every four to six hours as needed for cough or wheeze. 1 Inhaler 0  . levocetirizine (XYZAL) 5 MG tablet Take 1 tablet (5 mg total) by mouth every evening. 30 tablet 5  . metFORMIN (GLUCOPHAGE) 1000 MG tablet Take 1 tablet (1,000 mg total) by mouth 2 (two) times daily with a meal. 180 tablet 6  . methotrexate (50 MG/ML) 1 G injection once a week.    . methylphenidate (RITALIN LA) 20 MG 24 hr capsule Take 20 mg by mouth every morning.    . methylphenidate (RITALIN LA) 30 MG 24 hr capsule Take 30 mg by mouth every morning. Reported on 01/16/2015  0  . Mometasone Furoate (ASMANEX HFA)  200 MCG/ACT AERO Inhale 2 puffs into the lungs 2 (two) times daily. Rinse, gargle, and spit after use. 1 Inhaler 5  . Multiple Vitamin (MULTI-VITAMINS) TABS 1 tablet.    . Olopatadine HCl (PATADAY) 0.2 % SOLN Use one drop in each eye once daily as needed. 2.5 mL 5  . pantoprazole (PROTONIX) 40 MG tablet Take 1 tablet (40 mg total) by mouth 2 (two) times daily. 60 tablet 5  . PROAIR HFA 108 (90 BASE) MCG/ACT inhaler USE 2 INHALATIONS EVERY 4 TO 6 HOURS AS NEEDED FOR COUGH OR WHEEZE, CAN USE 2 INHALATIONS 10 TO 20 MINUTES PRIOR TO EXERCISE 25.5 g 0  . SYNTHROID 125 MCG tablet Take 1 tablet (125 mcg total) by mouth daily. 90 tablet 3  . tizanidine (ZANAFLEX) 6 MG capsule Take 6 mg by mouth 3 (three) times daily.    . traMADol (ULTRAM) 50 MG tablet Reported on 01/15/2015  0  . calcium-vitamin D (CALCIUM 500/D) 500-200 MG-UNIT per tablet 1 tablet.    . ciprofloxacin (CIPRO) 500 MG tablet Reported on 01/18/2015  0  . EPINEPHrine (EPIPEN 2-PAK) 0.3 mg/0.3 mL IJ SOAJ injection Use as directed for life-threatening allergic reaction. (Patient not taking: Reported on 01/18/2015) 2 Device 3  . [DISCONTINUED] bethanechol (URECHOLINE) 5 MG tablet Take 1 tablet (5 mg total) by mouth 3 (three) times daily. 270 tablet 3   No current facility-administered medications for this visit.    Family History  Problem Relation Age of Onset  . Cholelithiasis Maternal Grandmother   . Diabetes Maternal Grandmother   . Cervical cancer Maternal Grandmother   . Stroke Maternal Grandmother   . Diabetes Mother   . Hyperlipidemia Father   . Hypertension Father   . Diabetes Father   . Colon cancer Father   . Cancer Maternal Grandfather   . Diabetes Maternal Grandfather   . Stroke Maternal Grandfather   . Thyroid disease Neg Hx   . Diabetes Paternal Grandmother   . Diabetes Paternal Grandfather     ROS:  Pertinent items are noted in HPI.  Otherwise, a comprehensive ROS was negative.  Exam:   BP 110/72 mmHg  Pulse  68  Resp 16  Ht 5' 6.75" (1.695 m)  Wt 244 lb (110.678 kg)  BMI 38.52 kg/m2  LMP 12/27/2014 Height: 5' 6.75" (169.5 cm) Ht Readings from Last 3 Encounters:  01/18/15 5' 6.75" (1.695 m)  01/16/15 5' 6.14" (1.68 m)  11/14/14  (1.702 m) (86 %*, Z = 1.06)   * Growth percentiles are based on CDC 2-20 Years data.    General appearance: alert, cooperative and appears stated  age Head: Normocephalic, without obvious abnormality, atraumatic Neck: no adenopathy, supple, symmetrical, trachea midline and thyroid normal to inspection and palpation and non-palpable Lungs: clear to auscultation bilaterally Breasts: normal appearance, no masses or tenderness, No nipple retraction or dimpling, No nipple discharge or bleeding, No axillary or supraclavicular adenopathy Heart: regular rate and rhythm Abdomen: soft, non-tender; no masses,  no organomegaly Extremities: extremities normal, atraumatic, no cyanosis or edema Skin: Skin color, texture, turgor normal. No rashes or lesions Lymph nodes: Cervical, supraclavicular, and axillary nodes normal. No abnormal inguinal nodes palpated Neurologic: Grossly normal   Pelvic: External genitalia:  no lesions              Urethra:  normal appearing urethra with no masses, tenderness or lesions              Bartholin's and Skene's: normal                 Vagina: normal appearing vagina with normal color and discharge, no lesions              Cervix: normal,non tender, no lesions IUD string noted in cervix, appropriate length              Pap taken: No. Bimanual Exam:  Uterus:  normal size, contour, position, consistency, mobility, non-tender              Adnexa: normal adnexa and no mass, fullness, tenderness               Rectovaginal: Confirms               Anus:  normal appearance  Chaperone present: yes  A:  Well Woman with normal exam  Menorrhagia history Skyla IUD working well for cycle control  Due for removal 2/18  Hypertension, asthma,  Hypothyroid/Type 2 diabetes/cholesterol and joint problem with MD management     P:   Reviewed health and wellness pertinent to exam  Warning signs of IUD use given and will need to advise if occurs  Continue follow with MD as indicated  Pap smear as above start ag age 89  Wished safe travel on her mission trip   counseled on breast self exam, STD prevention, HIV risk factors and prevention, adequate intake of calcium and vitamin D, diet and exercise  return annually or prn  An After Visit Summary was printed and given to the patient.

## 2015-01-22 ENCOUNTER — Other Ambulatory Visit: Payer: Self-pay | Admitting: *Deleted

## 2015-01-22 MED ORDER — AZELASTINE HCL 0.05 % OP SOLN: 1.0000 [drp] | Freq: Two times a day (BID) | OPHTHALMIC | Status: DC

## 2015-02-05 ENCOUNTER — Ambulatory Visit: Payer: Self-pay | Admitting: Certified Nurse Midwife

## 2015-02-18 DIAGNOSIS — N200 Calculus of kidney: Secondary | ICD-10-CM | POA: Insufficient documentation

## 2015-02-20 ENCOUNTER — Ambulatory Visit: Payer: 59 | Admitting: Allergy and Immunology

## 2015-03-15 ENCOUNTER — Other Ambulatory Visit: Payer: Self-pay | Admitting: Allergy and Immunology

## 2015-03-26 ENCOUNTER — Other Ambulatory Visit: Payer: Self-pay | Admitting: Pediatrics

## 2015-04-08 ENCOUNTER — Other Ambulatory Visit: Payer: Self-pay

## 2015-04-08 ENCOUNTER — Other Ambulatory Visit: Payer: Self-pay | Admitting: *Deleted

## 2015-04-08 DIAGNOSIS — R1013 Epigastric pain: Secondary | ICD-10-CM

## 2015-04-08 MED ORDER — PANTOPRAZOLE SODIUM 40 MG PO TBEC: 40.0000 mg | DELAYED_RELEASE_TABLET | Freq: Two times a day (BID) | ORAL | Status: DC

## 2015-04-09 ENCOUNTER — Other Ambulatory Visit: Payer: Self-pay

## 2015-04-09 MED ORDER — AZELASTINE HCL 0.05 % OP SOLN: 1.0000 [drp] | Freq: Two times a day (BID) | OPHTHALMIC | Status: DC

## 2015-04-12 ENCOUNTER — Other Ambulatory Visit: Payer: Self-pay | Admitting: Allergy and Immunology

## 2015-04-12 NOTE — Telephone Encounter (Signed)
Pt mom called to get us to call her eye drops and [facility] . Mom said that the pharmacy has fax [phone] times .[phone].

## 2015-04-15 ENCOUNTER — Other Ambulatory Visit: Payer: Self-pay

## 2015-04-15 MED ORDER — OLOPATADINE HCL 0.2 % OP SOLN: OPHTHALMIC | Status: DC

## 2015-04-15 MED ORDER — AZELASTINE HCL 0.05 % OP SOLN: 1.0000 [drp] | Freq: Two times a day (BID) | OPHTHALMIC | Status: DC

## 2015-04-15 MED ORDER — PROAIR HFA 108 (90 BASE) MCG/ACT IN AERS: INHALATION_SPRAY | RESPIRATORY_TRACT | Status: DC

## 2015-04-15 NOTE — Telephone Encounter (Signed)
Called [phone] to verify medications that need to be ordered. No answer

## 2015-04-16 NOTE — Telephone Encounter (Signed)
Medications ordered per Mom's request and conversation

## 2015-05-08 DIAGNOSIS — S060XAA Concussion with loss of consciousness status unknown, initial encounter: Secondary | ICD-10-CM | POA: Insufficient documentation

## 2015-05-08 DIAGNOSIS — S060X9A Concussion with loss of consciousness of unspecified duration, initial encounter: Secondary | ICD-10-CM | POA: Insufficient documentation

## 2015-07-05 ENCOUNTER — Telehealth: Payer: Self-pay | Admitting: Certified Nurse Midwife

## 2015-07-05 NOTE — Telephone Encounter (Signed)
Patient's mom, Marcellina MillinDana Mattioli (DPR on file to share PHI), called requesting an appointment for the patient for this coming Monday or Tuesday. She said, "My daughter was seen in the ER last night for pelvic pain and they found a large cyst. She is out of town for school and won't be back until early next week." Patient's mom, Annabelle HarmanDana, aware the nurse will call her to get [facility].

## 2015-07-05 NOTE — Telephone Encounter (Signed)
I agree with your recommendations and plan for me to see patient next week.  You may close the encounter.

## 2015-07-05 NOTE — Telephone Encounter (Signed)
Spoke with patient's mother Annabelle HarmanDana, okay per ROI. Mother states that the patient was seen at [facility] last night due to lower back pain. Thought she may have a kidney stone as she has a history of this. Per mother the patient was diagnosed with a 3.5 cm ovarian cyst. The patient just started her cycle and was advised by the ED to get plenty of rest and that this should resolve, but she will need follow up with her GYN. The patient is out of town. Requesting an appointment for Monday or Tuesday. Appointment scheduled for 07/08/2015 at 2:30 pm with Dr.Silva. Agreeable to date and time. Advised patient may take 600-800 mg of Ibuprofen/Motrin every 6-8 hours for discomfort. Will need to get plenty of rest and drink plenty of fluids.  If pain increases or she develops a fever, nausea, or vomiting will need to be seen with the ER for follow up. Mother is agreeable. Aware she may also use a heating pad to relieve discomfort. Heating pad precautions given.   Call to [facility] records department. I have faxed a release of records request to [phone] with cover sheet and confirmation.  Dr.Silva, anything further for this patient?

## 2015-07-08 ENCOUNTER — Telehealth: Payer: Self-pay | Admitting: Obstetrics and Gynecology

## 2015-07-08 ENCOUNTER — Ambulatory Visit: Payer: 59 | Admitting: Obstetrics and Gynecology

## 2015-07-08 NOTE — Telephone Encounter (Signed)
Patient called and left a message on the answering machine at lunch stating, "I am not going to make it to my 2:15 PM appointment today, I've come down with a stomach bug. I'll have to call back and reschedule later this week." FYI only. Will hold message to be sure she reschedules.

## 2015-07-09 ENCOUNTER — Encounter: Payer: Self-pay | Admitting: Allergy and Immunology

## 2015-07-09 ENCOUNTER — Ambulatory Visit (INDEPENDENT_AMBULATORY_CARE_PROVIDER_SITE_OTHER): Payer: 59 | Admitting: Allergy and Immunology

## 2015-07-09 VITALS — BP 140/82 | HR 80 | Resp 16

## 2015-07-09 DIAGNOSIS — L209 Atopic dermatitis, unspecified: Secondary | ICD-10-CM | POA: Diagnosis not present

## 2015-07-09 DIAGNOSIS — J4541 Moderate persistent asthma with (acute) exacerbation: Secondary | ICD-10-CM

## 2015-07-09 DIAGNOSIS — J387 Other diseases of larynx: Secondary | ICD-10-CM | POA: Diagnosis not present

## 2015-07-09 DIAGNOSIS — J309 Allergic rhinitis, unspecified: Secondary | ICD-10-CM | POA: Diagnosis not present

## 2015-07-09 DIAGNOSIS — R1013 Epigastric pain: Secondary | ICD-10-CM

## 2015-07-09 DIAGNOSIS — H1010 Acute atopic conjunctivitis, unspecified eye: Secondary | ICD-10-CM

## 2015-07-09 DIAGNOSIS — L52 Erythema nodosum: Secondary | ICD-10-CM | POA: Diagnosis not present

## 2015-07-09 DIAGNOSIS — J4531 Mild persistent asthma with (acute) exacerbation: Secondary | ICD-10-CM | POA: Diagnosis not present

## 2015-07-09 DIAGNOSIS — K219 Gastro-esophageal reflux disease without esophagitis: Secondary | ICD-10-CM

## 2015-07-09 MED ORDER — PANTOPRAZOLE SODIUM 40 MG PO TBEC: 40.0000 mg | DELAYED_RELEASE_TABLET | Freq: Two times a day (BID) | ORAL | Status: DC

## 2015-07-09 MED ORDER — PROAIR HFA 108 (90 BASE) MCG/ACT IN AERS: INHALATION_SPRAY | RESPIRATORY_TRACT | Status: DC

## 2015-07-09 MED ORDER — FLUTICASONE FUROATE-VILANTEROL 200-25 MCG/INH IN AEPB: 1.0000 | INHALATION_SPRAY | Freq: Every day | RESPIRATORY_TRACT | Status: DC

## 2015-07-09 MED ORDER — LEVOCETIRIZINE DIHYDROCHLORIDE 5 MG PO TABS: 5.0000 mg | ORAL_TABLET | Freq: Every evening | ORAL | Status: DC

## 2015-07-09 NOTE — Telephone Encounter (Signed)
Thank you for the update!

## 2015-07-09 NOTE — Patient Instructions (Addendum)
  1. Start Breo 200 one inhalation 1 time per day. Sample. Coupon.  2. Continue ProAir HFA 2 puffs or albuterol nebulization every 4-6 hours if needed  3. Continue Protonix 40 two times per day  4. Continue antihistamine / levocetirizine 5 mg and Optivar if needed  5. Continue desonide if needed  6. Return to clinic in 3 months or earlier if problem  7. Obtain fall flu vaccine

## 2015-07-09 NOTE — Progress Notes (Signed)
Follow-up Note  Referring Provider: Aggie Hacker, MD Primary Provider: Beverely Low, MD Date of Office Visit: 07/09/2015  Subjective:   Valerie Marquez (DOB: [birth year]) is a 20 y.o. female who [facility] in re-evaluation of the following:  HPI: [name] returns to this clinic in reevaluation of her asthma, allergic rhinoconjunctivitis, LPR, history of atopic dermatitis, and history of erythema nodosum. I last saw her in this clinic in December 2016.  Her asthma has been somewhat active over the course of the past several months. She has not using her Asmanex consistently but has been using a short acting bronchodilator twice a day for chest tightness and some occasional shortness of breath. She has not received systemic steroids since last being seen in this clinic.  Her nose has been doing quite well and she's not had any need to use an antibiotic for treatment of sinusitis since she was last seen in this clinic.  Reflux is under very good control on her current medical therapy.  She is on Humira which appears to be helping not just her psoriatic arthritis but also her erythema nodosum.    Medication List           acetaminophen 500 MG tablet  Commonly known as:  TYLENOL  Take 1,000 mg by mouth every 4 (four) hours as needed for fever (alternating with ibuprofen). Reported on 01/15/2015     PROAIR HFA 108 (90 Base) MCG/ACT inhaler  Generic drug:  albuterol  USE 2 INHALATIONS EVERY 4 TO 6 HOURS AS NEEDED FOR COUGH OR WHEEZE, CAN USE 2 INHALATIONS 10 TO 20 MINUTES PRIOR TO EXERCISE     albuterol (2.5 MG/3ML) 0.083% nebulizer solution  Commonly known as:  PROVENTIL  Take 2.5 mg by nebulization every 6 (six) hours as needed for wheezing. Reported on 01/15/2015     azelastine 0.05 % ophthalmic solution  Commonly known as:  OPTIVAR  Place 1 drop into both eyes 2 (two) times daily.     B-complex with vitamin C tablet  Take 1  tablet by mouth 2 (two) times daily.     CALCIUM 500/D 500-200 MG-UNIT tablet  Generic drug:  calcium-vitamin D  1 tablet.     desonide 0.05 % cream  Commonly known as:  DESOWEN  Apply to affected areas once daily as needed.     EPINEPHrine 0.3 mg/0.3 mL Soaj injection  Commonly known as:  EPIPEN 2-PAK  Use as directed for life-threatening allergic reaction.     ergocalciferol 50000 units capsule  Commonly known as:  VITAMIN D2  Take 1 capsule once a week for 12 weeks     ezetimibe 10 MG tablet  Commonly known as:  ZETIA  Take 1 tablet (10 mg total) by mouth daily.     HUMIRA [place]  Inject into the skin.     ibuprofen 200 MG tablet  Commonly known as:  ADVIL,MOTRIN  Take 600 mg by mouth every 6 (six) hours as needed for pain or fever (alternating with tlyenol). Reported on 01/15/2015     levocetirizine 5 MG tablet  Commonly known as:  XYZAL  Take 1 tablet (5 mg total) by mouth every evening.     metFORMIN 1000 MG tablet  Commonly known as:  GLUCOPHAGE  Take 1 tablet (1,000 mg total) by mouth 2 (two) times daily with a meal.     methylphenidate 20 MG 24 hr capsule  Commonly known as:  RITALIN  LA  Take 20 mg by mouth every morning.     Mometasone Furoate 200 MCG/ACT Aero  Commonly known as:  ASMANEX HFA  Inhale 2 puffs into the lungs 2 (two) times daily. Rinse, gargle, and spit after use.     MULTI-VITAMINS Tabs  1 tablet.     NOVOFINE 32G X 6 MM Misc  Generic drug:  Insulin Pen Needle  USE 1 NEEDLE DAILY     Olopatadine HCl 0.2 % Soln  Commonly known as:  PATADAY  Use one drop in each eye once daily as needed.     pantoprazole 40 MG tablet  Commonly known as:  PROTONIX  Take 1 tablet (40 mg total) by mouth 2 (two) times daily.     SYNTHROID 125 MCG tablet  Generic drug:  levothyroxine  Take 1 tablet (125 mcg total) by mouth daily.     tizanidine 6 MG capsule  Commonly known as:  ZANAFLEX  Take 6 mg by mouth 3 (three) times daily.     traMADol 50 MG  tablet  Commonly known as:  ULTRAM  Reported on 01/15/2015        Past Medical History  Diagnosis Date  . Gastroesophageal reflux   . Obesity   . Acanthosis   . Abnormal thyroid function test   . Combined hyperlipidemia   . Dyspepsia   . Prediabetes   . Hypertension   . Abnormal liver function tests   . Asthma   . Diabetes mellitus without complication (HCC)   . Erythema nodosum   . Iron deficiency anemia   . Allergic rhinitis   . Diabetes mellitus (HCC)   . Eczema   . Goiter     hypothyroidsm--hashimoto's  . Lactose intolerance   . Concussion   . Psoriatic arthritis [facility](HCC)     Past Surgical History  Procedure Laterality Date  . Tympanostomy tube placement    . Tonsillectomy and adenoidectomy    . Sinuses    . Ankle surgery Right     --bone spur  . Wisdom tooth extraction    . Knee surgery Left 2014    Allergies  Allergen Reactions  . Corn-Containing Products   . Peanut-Containing Drug Products   . Amoxicillin Nausea And Vomiting and Rash    No reaction listed  . Other Rash    Corn, soy, nuts (all types), cats, dogs    Review of systems negative except as noted in HPI / PMHx or noted below:  Review of Systems  Constitutional: Negative.   HENT: Negative.   Eyes: Negative.   Respiratory: Negative.   Cardiovascular: Negative.   Gastrointestinal: Negative.   Genitourinary: Negative.   Musculoskeletal: Negative.   Skin: Negative.   Neurological: Negative.   Endo/Heme/Allergies: Negative.   Psychiatric/Behavioral: Negative.      Objective:   Filed Vitals:   07/09/15 1118  BP: 140/82  Pulse: 80  Resp: 16          Physical Exam  Constitutional: She is well-developed, well-nourished, and in no distress.  HENT:  Head: Normocephalic.  Right Ear: External ear and ear canal normal. Tympanic membrane is scarred.  Left Ear: External ear and ear canal normal. Tympanic membrane is scarred.  Nose: Nose normal. No mucosal edema or rhinorrhea.    Mouth/Throat: Uvula is midline, oropharynx is clear and moist and mucous membranes are normal. No oropharyngeal exudate.  Eyes: Conjunctivae are normal.  Neck: Trachea normal. No tracheal tenderness present. No tracheal deviation present. No thyromegaly  present.  Cardiovascular: Normal rate, regular rhythm, S1 normal, S2 normal and normal heart sounds.   No murmur heard. Pulmonary/Chest: Breath sounds normal. No stridor. No respiratory distress. She has no wheezes. She has no rales.  Musculoskeletal: She exhibits no edema.  Lymphadenopathy:       Head (right side): No tonsillar adenopathy present.       Head (left side): No tonsillar adenopathy present.    She has no cervical adenopathy.  Neurological: She is alert. Gait normal.  Skin: Rash (Globally dry lichenified skin) noted. She is not diaphoretic. No erythema. Nails show no clubbing.  Psychiatric: Mood and affect normal.    Diagnostics:    Spirometry was performed and demonstrated an FEV1 of 3.67 at 108 % of predicted.  The patient had an Asthma Control Test with the following results: ACT Total Score: 11.    Assessment and Plan:   1. Asthma, not well controlled, moderate persistent, with acute exacerbation   2. Allergic rhinoconjunctivitis   3. Atopic dermatitis   4. Erythema nodosum   5. LPRD (laryngopharyngeal reflux disease)   6. Asthma, mild persistent, with acute exacerbation   7. Dyspepsia     1. Start Breo 200 one inhalation 1 time per day. Sample. Coupon.  2. Continue ProAir HFA 2 puffs or albuterol nebulization every 4-6 hours if needed  3. Continue Protonix 40 two times per day  4. Continue antihistamine / levocetirizine 5 mg and Optivar if needed  5. Continue desonide if needed  6. Return to clinic in 3 months or earlier if problem  [name] needs to use some form of inhaled steroid. She has been very noncompliant about using controller agents on a regular basis and we'll now try her on a combination agent  assuming that the convenience of using this medication one time per day will allow her to become more compliant in using an adequate plan to control her respiratory tract inflammation and her use of a short acting bronchodilator. She'll continue to use Protonix and antihistamines and topical steroids. We'll see her back in this clinic approximately 3 months or earlier if there is a problem. She'll need to obtain a flu vaccine this fall and I've informed her about this requirement.  Laurette Schimke, MD [facility]

## 2015-07-25 ENCOUNTER — Telehealth: Payer: Self-pay | Admitting: Emergency Medicine

## 2015-07-25 DIAGNOSIS — N83202 Unspecified ovarian cyst, left side: Secondary | ICD-10-CM

## 2015-07-25 NOTE — Telephone Encounter (Signed)
Calling patient with message from Dr. Edward JollySilva that is hand written on results from ultrasound completed at [facility] on 07/04/2015.  Per Dr. Edward JollySilva, patient needs follow up ultrasound to recheck L Ovarian Cyst that was seen on ultrasound.  Needs pelvic ultrasound 3 months from 07/04/15 to ensure cyst resolution.  Patient also needs appointment with Dr. Edward JollySilva if having pain or symptomatic.   Message left to return call to [facility] at [phone].

## 2015-07-29 NOTE — Telephone Encounter (Signed)
Patient returned call. She is aware of need for follow up. Scheduled ultrasound 10/03/15, she is advised of cancellation policy and will call back if she needs to change appointment.  Routing to provider for final review. Patient agreeable to disposition. Will close encounter.    cc Harland DingwallSuzy Dixon for insurance pre-certification, referral processing and patient contact.

## 2015-08-19 ENCOUNTER — Other Ambulatory Visit: Payer: Self-pay | Admitting: *Deleted

## 2015-08-19 DIAGNOSIS — L309 Dermatitis, unspecified: Secondary | ICD-10-CM

## 2015-08-19 MED ORDER — DESONIDE 0.05 % EX CREA: TOPICAL_CREAM | CUTANEOUS | 2 refills | Status: DC

## 2015-08-22 ENCOUNTER — Other Ambulatory Visit: Payer: Self-pay

## 2015-08-22 DIAGNOSIS — L309 Dermatitis, unspecified: Secondary | ICD-10-CM

## 2015-08-22 MED ORDER — DESONIDE 0.05 % EX CREA: TOPICAL_CREAM | CUTANEOUS | 5 refills | Status: DC

## 2015-10-03 ENCOUNTER — Ambulatory Visit (INDEPENDENT_AMBULATORY_CARE_PROVIDER_SITE_OTHER): Payer: 59

## 2015-10-03 ENCOUNTER — Ambulatory Visit (INDEPENDENT_AMBULATORY_CARE_PROVIDER_SITE_OTHER): Payer: 59 | Admitting: Obstetrics and Gynecology

## 2015-10-03 ENCOUNTER — Encounter: Payer: Self-pay | Admitting: Obstetrics and Gynecology

## 2015-10-03 VITALS — BP 138/84 | HR 76 | Resp 12 | Ht 66.75 in | Wt 229.0 lb

## 2015-10-03 DIAGNOSIS — N83201 Unspecified ovarian cyst, right side: Secondary | ICD-10-CM

## 2015-10-03 DIAGNOSIS — N83202 Unspecified ovarian cyst, left side: Secondary | ICD-10-CM

## 2015-10-03 NOTE — Progress Notes (Signed)
Patient ID: Valerie Marquez, female   DOB: 1995-02-19, 20 y.o.   MRN: [id number] GYNECOLOGY  VISIT   HPI: 20 y.o.   Single  Caucasian  female   G0P0000 with Patient's last menstrual period was 08/27/2015 (approximate).   here for pelvic ultrasound for follow up ultrasound to recheck left ovarian cyst.    Seen at [facility] for flank pain and generalized abdominal pain.  Thought she had a recurrent kidney stone. Pain is now gone.   Had CT scan 07/04/15 showing  3 mm stone in the lower right kidney.  Pelvic U/S [facility] 07/04/15 showing:  IUD in endometrial canal.  Right adnexal cyst 3.4 x 2.9 x 2.8 cm.   Total ovary measuring 4.3 x 3.5 x 3.3 cm.  Left ovary 2.1 x 1.4 x 1.4 cm.  Normal blood flow to both ovaries.  Free fluid seen.   Really light menses with Skyla IUD.  Happy with this.   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   GYNECOLOGIC HISTORY: Patient's last menstrual period was 08/27/2015 (approximate). Contraception:  IUD.   Skyla 03/10/13.  Last pap smear:    NA        OB History    Gravida Para Term Preterm AB Living   0 0 0 0 0 0   SAB TAB Ectopic Multiple Live Births   0 0 0 0           Patient Active Problem List   Diagnosis Date Noted  . Precordial chest pain 11/13/2014  . Erythema nodosum   . Microscopic hematuria   . Vitamin D deficiency 05/24/2014  . Eczema of both hands 05/24/2014  . Menorrhagia 11/10/2012  . RUQ abdominal pain 11/09/2012  . Abnormal findings on diagnostic imaging of gall bladder 11/09/2012  . Type 2 diabetes mellitus (HCC) 04/07/2012  . Hypothyroid 01/04/2012  . Iron deficiency anemia 01/04/2012  . Acanthosis   . Goiter   . Abnormal thyroid function test   . Combined hyperlipidemia   . Dyspepsia   . Hypertension   . Abnormal transaminases   . Asthma   . Gastroesophageal reflux   . Obesity 06/30/2010  . Mixed hyperlipidemia 06/30/2010    Past Medical History:  Diagnosis Date  . Abnormal liver function  tests   . Abnormal thyroid function test   . Acanthosis   . Allergic rhinitis   . Asthma   . Combined hyperlipidemia   . Concussion   . Diabetes mellitus (HCC)   . Diabetes mellitus without complication (HCC)   . Dyspepsia   . Eczema   . Erythema nodosum   . Gastroesophageal reflux   . Goiter    hypothyroidsm--hashimoto's  . Hypertension   . Iron deficiency anemia   . Lactose intolerance   . Obesity   . Prediabetes   . Psoriatic arthritis [facility])     Past Surgical History:  Procedure Laterality Date  . ANKLE SURGERY Right    --bone spur  . KNEE SURGERY Left 2014  . sinuses    . TONSILLECTOMY AND ADENOIDECTOMY    . TYMPANOSTOMY TUBE PLACEMENT    . WISDOM TOOTH EXTRACTION      Current Outpatient Prescriptions  Medication Sig Dispense Refill  . acetaminophen (TYLENOL) 500 MG tablet Take 1,000 mg by mouth every 4 (four) hours as needed for fever (alternating with ibuprofen). Reported on 01/15/2015    . Adalimumab (HUMIRA [place]) Inject into the skin.    Marland Kitchen albuterol (PROVENTIL) (2.5  MG/3ML) 0.083% nebulizer solution Take 2.5 mg by nebulization every 6 (six) hours as needed for wheezing. Reported on 01/15/2015    . azelastine (OPTIVAR) 0.05 % ophthalmic solution Place 1 drop into both eyes 2 (two) times daily. 6 mL 5  . B Complex-C (B-COMPLEX WITH VITAMIN C) tablet Take 1 tablet by mouth 2 (two) times daily.    . calcium-vitamin D (CALCIUM 500/D) 500-200 MG-UNIT per tablet 1 tablet.    Marland Kitchen. desonide (DESOWEN) 0.05 % cream Apply to affected areas once daily as needed. 60 g 5  . EPINEPHrine (EPIPEN 2-PAK) 0.3 mg/0.3 mL IJ SOAJ injection Use as directed for life-threatening allergic reaction. 2 Device 3  . ergocalciferol (VITAMIN D2) 50000 UNITS capsule Take 1 capsule once a week for 12 weeks 12 capsule 2  . ezetimibe (ZETIA) 10 MG tablet Take 1 tablet (10 mg total) by mouth daily. 90 tablet 6  . fluticasone furoate-vilanterol (BREO ELLIPTA) 200-25 MCG/INH AEPB Inhale 1 puff into the  lungs daily. 1 each 5  . ibuprofen (ADVIL,MOTRIN) 200 MG tablet Take 600 mg by mouth every 6 (six) hours as needed for pain or fever (alternating with tlyenol). Reported on 01/15/2015    . levocetirizine (XYZAL) 5 MG tablet Take 1 tablet (5 mg total) by mouth every evening. 90 tablet 3  . metFORMIN (GLUCOPHAGE) 1000 MG tablet Take 1 tablet (1,000 mg total) by mouth 2 (two) times daily with a meal. 180 tablet 6  . methotrexate (50 MG/ML) 1 G injection once a week.    . methylphenidate (RITALIN [place]) 20 MG 24 hr capsule Take 20 mg by mouth every morning.    . Mometasone Furoate (ASMANEX HFA) 200 MCG/ACT AERO Inhale 2 puffs into the lungs 2 (two) times daily. Rinse, gargle, and spit after use. 1 Inhaler 5  . Multiple Vitamin (MULTI-VITAMINS) TABS 1 tablet.    Marland Kitchen. NOVOFINE 32G X 6 MM MISC USE 1 NEEDLE DAILY 100 each 6  . Olopatadine HCl (PATADAY) 0.2 % SOLN Use one drop in each eye once daily as needed. 2.5 mL 5  . pantoprazole (PROTONIX) 40 MG tablet Take 1 tablet (40 mg total) by mouth 2 (two) times daily. 180 tablet 3  . PROAIR HFA 108 (90 Base) MCG/ACT inhaler USE 2 INHALATIONS EVERY 4 TO 6 HOURS AS NEEDED FOR COUGH OR WHEEZE, CAN USE 2 INHALATIONS 10 TO 20 MINUTES PRIOR TO EXERCISE 1 Inhaler 1  . SYNTHROID 125 MCG tablet Take 1 tablet (125 mcg total) by mouth daily. 90 tablet 3  . tizanidine (ZANAFLEX) 6 MG capsule Take 6 mg by mouth 3 (three) times daily.    . traMADol (ULTRAM) 50 MG tablet Reported on 01/15/2015  0   No current facility-administered medications for this visit.      ALLERGIES: Corn-containing products; Peanut-containing drug products; Amoxicillin; and Other  Family History  Problem Relation Age of Onset  . Diabetes Mother   . Hyperlipidemia Father   . Hypertension Father   . Diabetes Father   . Colon cancer Father   . Cholelithiasis Maternal Grandmother   . Diabetes Maternal Grandmother   . Cervical cancer Maternal Grandmother   . Stroke Maternal Grandmother   . Cancer  Maternal Grandfather   . Diabetes Maternal Grandfather   . Stroke Maternal Grandfather   . Diabetes Paternal Grandmother   . Diabetes Paternal Grandfather   . Thyroid disease Neg Hx     Social History   Social History  . Marital status: Single  Spouse name: N/A  . Number of children: N/A  . Years of education: N/A   Occupational History  . Not on file.   Social History Main Topics  . Smoking status: Never Smoker  . Smokeless tobacco: Never Used  . Alcohol use No  . Drug use: No  . Sexual activity: No     Comment: Skyla IUD--inserted 03-10-13   Other Topics Concern  . Not on file   Social History Narrative   Dad deceased Feb 20, 2022.     ROS:  Pertinent items are noted in HPI.  PHYSICAL EXAMINATION:    BP 138/84 (BP Location: Left Arm, Patient Position: Sitting, Cuff Size: Large)   Pulse 76   Resp 12   Ht 5' 6.75" (1.695 m)   Wt 229 lb (103.9 kg)   LMP 08/27/2015 (Approximate)   BMI 36.14 kg/m     General appearance: alert, cooperative and appears stated age   ASSESSMENT  Abdominal pain resolved.  Skyla IUD patient.  Ovarian cyst resolved.  Hx renal stones.   PLAN  Reassurance regarding normal ultrasound appearance.  Discussion of ovarian cysts.  Understands that IUDs do not prevent them.  Dicussed good hydration, avoidance of beverages with diuretics, and calcium exposure through normal dietary sources in order to reduce risk of recurrent renal stones.  Follow up for annual exam after Jan 18, 2016 and prn.   An After Visit Summary was printed and given to the patient.  ___15___ minutes face to face time of which over 50% was spent in counseling.

## 2015-10-03 NOTE — Progress Notes (Signed)
Pelvic ultrasound images and report reviewed with patient. Normal uterus with IUD in canal.  EMS 5.03 mm.  Normal ovaries.  No free fluid.

## 2015-10-21 ENCOUNTER — Telehealth: Payer: Self-pay | Admitting: Allergy and Immunology

## 2015-10-21 ENCOUNTER — Other Ambulatory Visit: Payer: Self-pay

## 2015-10-21 MED ORDER — FLUTICASONE FUROATE-VILANTEROL 200-25 MCG/INH IN AEPB: 1.0000 | INHALATION_SPRAY | Freq: Every day | RESPIRATORY_TRACT | 3 refills | Status: DC

## 2015-10-21 NOTE — Telephone Encounter (Signed)
The Breo samples that was given to her worked very well. Mom wants to know if she can come by and pick up a prescription for it today.

## 2015-10-21 NOTE — Telephone Encounter (Signed)
Printed and have it up front will inform pt

## 2015-11-26 ENCOUNTER — Telehealth: Payer: Self-pay | Admitting: Allergy and Immunology

## 2015-11-26 MED ORDER — FLUTICASONE FUROATE-VILANTEROL 200-25 MCG/INH IN AEPB
1.0000 | INHALATION_SPRAY | Freq: Every day | RESPIRATORY_TRACT | 5 refills | Status: DC
Start: 2015-11-26 — End: 2016-01-14

## 2015-11-26 NOTE — Telephone Encounter (Signed)
Sent it [employer]. Spoke with mom to make aware.

## 2015-11-26 NOTE — Telephone Encounter (Signed)
Mom called and said that the breo worked and needs to have it [facility] into [facility] [place] . [phone].

## 2016-01-06 ENCOUNTER — Other Ambulatory Visit (INDEPENDENT_AMBULATORY_CARE_PROVIDER_SITE_OTHER): Payer: Self-pay | Admitting: *Deleted

## 2016-01-06 ENCOUNTER — Telehealth (INDEPENDENT_AMBULATORY_CARE_PROVIDER_SITE_OTHER): Payer: Self-pay | Admitting: Pediatric Endocrinology

## 2016-01-06 DIAGNOSIS — R7303 Prediabetes: Secondary | ICD-10-CM

## 2016-01-06 NOTE — Telephone Encounter (Signed)
Labs in portal. 

## 2016-01-06 NOTE — Telephone Encounter (Signed)
Please place lab orders for patient's next appointment.

## 2016-01-09 ENCOUNTER — Ambulatory Visit: Payer: 59 | Admitting: Obstetrics and Gynecology

## 2016-01-13 ENCOUNTER — Encounter: Payer: Self-pay | Admitting: Pediatric Endocrinology

## 2016-01-13 LAB — HM DIABETES EYE EXAM

## 2016-01-14 ENCOUNTER — Ambulatory Visit (INDEPENDENT_AMBULATORY_CARE_PROVIDER_SITE_OTHER): Payer: 59 | Admitting: Nurse Practitioner

## 2016-01-14 ENCOUNTER — Encounter: Payer: Self-pay | Admitting: Nurse Practitioner

## 2016-01-14 ENCOUNTER — Ambulatory Visit: Payer: 59 | Admitting: Nurse Practitioner

## 2016-01-14 ENCOUNTER — Other Ambulatory Visit: Payer: Self-pay

## 2016-01-14 VITALS — BP 136/90 | HR 64 | Ht 66.75 in | Wt 244.0 lb

## 2016-01-14 DIAGNOSIS — Z975 Presence of (intrauterine) contraceptive device: Secondary | ICD-10-CM

## 2016-01-14 DIAGNOSIS — Z01419 Encounter for gynecological examination (general) (routine) without abnormal findings: Secondary | ICD-10-CM | POA: Diagnosis not present

## 2016-01-14 DIAGNOSIS — Z0000 Encounter for general adult medical examination without abnormal findings: Secondary | ICD-10-CM

## 2016-01-14 LAB — POCT URINALYSIS DIPSTICK
BILIRUBIN UA: NEGATIVE
Blood, UA: NEGATIVE
Glucose, UA: NEGATIVE
Ketones, UA: NEGATIVE
NITRITE UA: NEGATIVE
PH UA: 6
Protein, UA: NEGATIVE
UROBILINOGEN UA: NEGATIVE

## 2016-01-14 MED ORDER — ALBUTEROL SULFATE HFA 108 (90 BASE) MCG/ACT IN AERS: INHALATION_SPRAY | RESPIRATORY_TRACT | 0 refills | Status: DC

## 2016-01-14 MED ORDER — FLUTICASONE FUROATE-VILANTEROL 200-25 MCG/INH IN AEPB: 1.0000 | INHALATION_SPRAY | Freq: Every day | RESPIRATORY_TRACT | 0 refills | Status: DC

## 2016-01-14 MED ORDER — LEVOCETIRIZINE DIHYDROCHLORIDE 5 MG PO TABS: ORAL_TABLET | ORAL | 0 refills | Status: DC

## 2016-01-14 MED ORDER — OLOPATADINE HCL 0.2 % OP SOLN: OPHTHALMIC | 0 refills | Status: AC

## 2016-01-14 NOTE — Patient Instructions (Signed)
General topics  Next pap or exam is  due in 1 year Take a Women's multivitamin Take 1200 mg. of calcium daily - prefer dietary If any concerns in interim to call back  Breast Self-Awareness Practicing breast self-awareness may pick up problems early, prevent significant medical complications, and possibly save your life. By practicing breast self-awareness, you can become familiar with how your breasts look and feel and if your breasts are changing. This allows you to notice changes early. It can also offer you some reassurance that your breast health is good. One way to learn what is normal for your breasts and whether your breasts are changing is to do a breast self-exam. If you find a lump or something that was not present in the past, it is best to contact your caregiver right away. Other findings that should be evaluated by your caregiver include nipple discharge, especially if it is bloody; skin changes or reddening; areas where the skin seems to be pulled in (retracted); or new lumps and bumps. Breast pain is seldom associated with cancer (malignancy), but should also be evaluated by a caregiver. BREAST SELF-EXAM The best time to examine your breasts is 5 7 days after your menstrual period is over.  ExitCare Patient Information 2013 [employer].   Exercise to Stay Healthy Exercise helps you become and stay healthy. EXERCISE IDEAS AND TIPS Choose exercises that:  You enjoy.  Fit into your day. You do not need to exercise really hard to be healthy. You can do exercises at a slow or medium level and stay healthy. You can:  Stretch before and after working out.  Try yoga, Pilates, or tai chi.  Lift weights.  Walk fast, swim, jog, run, climb stairs, bicycle, dance, or rollerskate.  Take aerobic classes. Exercises that burn about 150 calories:  Running 1  miles in 15 minutes.  Playing volleyball for 45 to 60 minutes.  Washing and waxing a car for 45 to 60  minutes.  Playing touch football for 45 minutes.  Walking 1  miles in 35 minutes.  Pushing a stroller 1  miles in 30 minutes.  Playing basketball for 30 minutes.  Raking leaves for 30 minutes.  Bicycling 5 miles in 30 minutes.  Walking 2 miles in 30 minutes.  Dancing for 30 minutes.  Shoveling snow for 15 minutes.  Swimming laps for 20 minutes.  Walking up stairs for 15 minutes.  Bicycling 4 miles in 15 minutes.  Gardening for 30 to 45 minutes.  Jumping rope for 15 minutes.  Washing windows or floors for 45 to 60 minutes. Document Released: 02/14/2010 Document Revised: 04/06/2011 Document Reviewed: 02/14/2010 ExitCare Patient Information 2013 ExitCare[employer].   Other topics ( that may be useful information):    Sexually Transmitted Disease Sexually transmitted disease (STD) refers to any infection that is passed from person to person during sexual activity. This may happen by way of saliva, semen, blood, vaginal mucus, or urine. Common STDs include:  Gonorrhea.  Chlamydia.  Syphilis.  HIV/AIDS.  Genital herpes.  Hepatitis B and C.  Trichomonas.  Human papillomavirus (HPV).  Pubic lice. CAUSES  An STD may be spread by bacteria, virus, or parasite. A person can get an STD by:  Sexual intercourse with an infected person.  Sharing sex toys with an infected person.  Sharing needles with an infected person.  Having intimate contact with the genitals, mouth, or rectal areas of an infected person. SYMPTOMS  Some people may not have any symptoms, but   they can still pass the infection to others. Different STDs have different symptoms. Symptoms include:  Painful or bloody urination.  Pain in the pelvis, abdomen, vagina, anus, throat, or eyes.  Skin rash, itching, irritation, growths, or sores (lesions). These usually occur in the genital or anal area.  Abnormal vaginal discharge.  Penile discharge in men.  Soft, flesh-colored skin growths in the  genital or anal area.  Fever.  Pain or bleeding during sexual intercourse.  Swollen glands in the groin area.  Yellow skin and eyes (jaundice). This is seen with hepatitis. DIAGNOSIS  To make a diagnosis, your caregiver may:  Take a medical history.  Perform a physical exam.  Take a specimen (culture) to be examined.  Examine a sample of discharge under a microscope.  Perform blood test TREATMENT   Chlamydia, gonorrhea, trichomonas, and syphilis can be cured with antibiotic medicine.  Genital herpes, hepatitis, and HIV can be treated, but not cured, with prescribed medicines. The medicines will lessen the symptoms.  Genital warts from HPV can be treated with medicine or by freezing, burning (electrocautery), or surgery. Warts may come back.  HPV is a virus and cannot be cured with medicine or surgery.However, abnormal areas may be followed very closely by your caregiver and may be removed from the cervix, vagina, or vulva through office procedures or surgery. If your diagnosis is confirmed, your recent sexual partners need treatment. This is true even if they are symptom-free or have a negative culture or evaluation. They should not have sex until their caregiver says it is okay. HOME CARE INSTRUCTIONS  All sexual partners should be informed, tested, and treated for all STDs.  Take your antibiotics as directed. Finish them even if you start to feel better.  Only take over-the-counter or prescription medicines for pain, discomfort, or fever as directed by your caregiver.  Rest.  Eat a balanced diet and drink enough fluids to keep your urine clear or pale yellow.  Do not have sex until treatment is completed and you have followed up with your caregiver. STDs should be checked after treatment.  Keep all follow-up appointments, Pap tests, and blood tests as directed by your caregiver.  Only use latex condoms and water-soluble lubricants during sexual activity. Do not use  petroleum jelly or oils.  Avoid alcohol and illegal drugs.  Get vaccinated for HPV and hepatitis. If you have not received these vaccines in the past, talk to your caregiver about whether one or both might be right for you.  Avoid risky sex practices that can break the skin. The only way to avoid getting an STD is to avoid all sexual activity.Latex condoms and dental dams (for oral sex) will help lessen the risk of getting an STD, but will not completely eliminate the risk. SEEK MEDICAL CARE IF:   You have a fever.  You have any new or worsening symptoms. Document Released: 04/04/2002 Document Revised: 04/06/2011 Document Reviewed: 04/11/2010 [facility] Patient Information 2013 [employer].    Domestic Abuse You are being battered or abused if someone close to you hits, pushes, or physically hurts you in any way. You also are being abused if you are forced into activities. You are being sexually abused if you are forced to have sexual contact of any kind. You are being emotionally abused if you are made to feel worthless or if you are constantly threatened. It is important to remember that help is available. No one has the right to abuse you. PREVENTION OF FURTHER  ABUSE  Learn the warning signs of danger. This varies with situations but may include: the use of alcohol, threats, isolation from friends and family, or forced sexual contact. Leave if you feel that violence is going to occur.  If you are attacked or beaten, report it to the police so the abuse is documented. You do not have to press charges. The police can protect you while you or the attackers are leaving. Get the officer's name and badge number and a copy of the report.  Find someone you can trust and tell them what is happening to you: your caregiver, a nurse, clergy member, close friend or family member. Feeling ashamed is natural, but remember that you have done nothing wrong. No one deserves abuse. Document Released:  01/10/2000 Document Revised: 04/06/2011 Document Reviewed: 03/20/2010 ExitCare Patient Information 2013 [employer].    How Much is Too Much Alcohol? Drinking too much alcohol can cause injury, accidents, and health problems. These types of problems can include:   Car crashes.  Falls.  Family fighting (domestic violence).  Drowning.  Fights.  Injuries.  Burns.  Damage to certain organs.  Having a baby with birth defects. ONE DRINK CAN BE TOO MUCH WHEN YOU ARE:  Working.  Pregnant or breastfeeding.  Taking medicines. Ask your doctor.  Driving or planning to drive. If you or someone you know has a drinking problem, get help from a doctor.  Document Released: 11/08/2008 Document Revised: 04/06/2011 Document Reviewed: 11/08/2008 ExitCare Patient Information 2013 [employer]Care[employer].   Smoking Hazards Smoking cigarettes is extremely bad for your health. Tobacco smoke has over 200 known poisons in it. There are over 60 chemicals in tobacco smoke that cause cancer. Some of the chemicals found in cigarette smoke include:   Cyanide.  Benzene.  Formaldehyde.  Methanol (wood alcohol).  Acetylene (fuel used in welding torches).  Ammonia. Cigarette smoke also contains the poisonous gases nitrogen oxide and carbon monoxide.  Cigarette smokers have an increased risk of many serious medical problems and Smoking causes approximately:  90% of all lung cancer deaths in men.  80% of all lung cancer deaths in women.  90% of deaths from chronic obstructive lung disease. Compared with nonsmokers, smoking increases the risk of:  Coronary heart disease by 2 to 4 times.  Stroke by 2 to 4 times.  Men developing lung cancer by 23 times.  Women developing lung cancer by 13 times.  Dying from chronic obstructive lung diseases by 12 times.  . Smoking is the most preventable cause of death and disease in our society.  WHY IS SMOKING ADDICTIVE?  Nicotine is the chemical  agent in tobacco that is capable of causing addiction or dependence.  When you smoke and inhale, nicotine is absorbed rapidly into the bloodstream through your lungs. Nicotine absorbed through the lungs is capable of creating a powerful addiction. Both inhaled and non-inhaled nicotine may be addictive.  Addiction studies of cigarettes and spit tobacco show that addiction to nicotine occurs mainly during the teen years, when young people begin using tobacco products. WHAT ARE THE BENEFITS OF QUITTING?  There are many health benefits to quitting smoking.   Likelihood of developing cancer and heart disease decreases. Health improvements are seen almost immediately.  Blood pressure, pulse rate, and breathing patterns start returning to normal soon after quitting. QUITTING SMOKING   American Lung Association - 1-800-LUNGUSA  American Cancer Society - 1-800-ACS-2345 Document Released: 02/20/2004 Document Revised: 04/06/2011 Document Reviewed: 10/24/2008 ExitCare Patient Information 2013 ExitCare,   LLC.   Stress Management Stress is a state of physical or mental tension that often results from changes in your life or normal routine. Some common causes of stress are:  Death of a loved one.  Injuries or severe illnesses.  Getting fired or changing jobs.  Moving into a new home. Other causes may be:  Sexual problems.  Business or financial losses.  Taking on a large debt.  Regular conflict with someone at home or at work.  Constant tiredness from lack of sleep. It is not just bad things that are stressful. It may be stressful to:  Win the lottery.  Get married.  Buy a new car. The amount of stress that can be easily tolerated varies from person to person. Changes generally cause stress, regardless of the types of change. Too much stress can affect your health. It may lead to physical or emotional problems. Too little stress (boredom) may also become stressful. SUGGESTIONS TO  REDUCE STRESS:  Talk things over with your family and friends. It often is helpful to share your concerns and worries. If you feel your problem is serious, you may want to get help from a professional counselor.  Consider your problems one at a time instead of lumping them all together. Trying to take care of everything at once may seem impossible. List all the things you need to do and then start with the most important one. Set a goal to accomplish 2 or 3 things each day. If you expect to do too many in a single day you will naturally fail, causing you to feel even more stressed.  Do not use alcohol or drugs to relieve stress. Although you may feel better for a short time, they do not remove the problems that caused the stress. They can also be habit forming.  Exercise regularly - at least 3 times per week. Physical exercise can help to relieve that "uptight" feeling and will relax you.  The shortest distance between despair and hope is often a good night's sleep.  Go to bed and get up on time allowing yourself time for appointments without being rushed.  Take a short "time-out" period from any stressful situation that occurs during the day. Close your eyes and take some deep breaths. Starting with the muscles in your face, tense them, hold it for a few seconds, then relax. Repeat this with the muscles in your neck, shoulders, hand, stomach, back and legs.  Take good care of yourself. Eat a balanced diet and get plenty of rest.  Schedule time for having fun. Take a break from your daily routine to relax. HOME CARE INSTRUCTIONS   Call if you feel overwhelmed by your problems and feel you can no longer manage them on your own.  Return immediately if you feel like hurting yourself or someone else. Document Released: 07/08/2000 Document Revised: 04/06/2011 Document Reviewed: 02/28/2007 ExitCare Patient Information 2013 ExitCare, LLC.  

## 2016-01-14 NOTE — Progress Notes (Signed)
Patient ID: Valerie Marquez, female   DOB: 1996/01/04, 20 y.o.   MRN: [id number]  20 y.o. [id number] Single  Caucasian Fe here for annual exam. With Skyla IUD menses ranges from 25- 35 days apart.  Flow is light; lasting 4 days. Some cramps that are worse past 3-4 months.  OTC Midol does help and no missed days of school or work.  Never SA.  Plays [employer] in college. Recent CT & PUS 07/04/15 with a right kidney stone - passed in September.  She also had right OV cyst. Repeat PUS 10/03/15 with resolution of cyst.  Patient's last menstrual period was 01/07/2016 (exact date).          Sexually active: No.  The current method of family planning is IUD. Skyla placed 03/10/13. Exercising: Yes.    plays [employer] in college Smoker:  no  Health Maintenance: Pap: None per guidelines Colonoscopy: 2014, repeat in 5 years TDaP: 2010 Gardasil: completed in 2010 Pneumonia: 11/16/14 Pneumovax HIV: declines today Labs: will have done with endocrinology  Urine: 1+ leuk's   reports that she has never smoked. She has never used smokeless tobacco. She reports that she does not drink alcohol or use drugs.  Past Medical History:  Diagnosis Date  . Abnormal liver function tests   . Abnormal thyroid function test   . Acanthosis   . Allergic rhinitis   . Asthma   . Combined hyperlipidemia   . Concussion   . Diabetes mellitus (HCC)   . Diabetes mellitus without complication (HCC)   . Dyspepsia   . Eczema   . Erythema nodosum   . Gastroesophageal reflux   . Goiter    hypothyroidsm--hashimoto's  . Hypertension   . Iron deficiency anemia   . Lactose intolerance   . Obesity   . Prediabetes   . Psoriatic arthritis [facility])     Past Surgical History:  Procedure Laterality Date  . ANKLE SURGERY Right    --bone spur  . KNEE SURGERY Left 2014  . sinuses    . TONSILLECTOMY AND ADENOIDECTOMY    . TYMPANOSTOMY TUBE PLACEMENT    . WISDOM TOOTH EXTRACTION      Current Outpatient Prescriptions  Medication Sig  Dispense Refill  . acetaminophen (TYLENOL) 500 MG tablet Take 1,000 mg by mouth every 4 (four) hours as needed for fever (alternating with ibuprofen). Reported on 01/15/2015    . albuterol (PROVENTIL) (2.5 MG/3ML) 0.083% nebulizer solution Take 2.5 mg by nebulization every 6 (six) hours as needed for wheezing. Reported on 01/15/2015    . azelastine (OPTIVAR) 0.05 % ophthalmic solution Place 1 drop into both eyes 2 (two) times daily. 6 mL 5  . B Complex-C (B-COMPLEX WITH VITAMIN C) tablet Take 1 tablet by mouth 2 (two) times daily.    . calcium-vitamin D (CALCIUM 500/D) 500-200 MG-UNIT per tablet 1 tablet.    Marland Kitchen desonide (DESOWEN) 0.05 % cream Apply to affected areas once daily as needed. 60 g 5  . EPINEPHrine (EPIPEN 2-PAK) 0.3 mg/0.3 mL IJ SOAJ injection Use as directed for life-threatening allergic reaction. 2 Device 3  . ergocalciferol (VITAMIN D2) 50000 UNITS capsule Take 1 capsule once a week for 12 weeks 12 capsule 2  . ezetimibe (ZETIA) 10 MG tablet Take 1 tablet (10 mg total) by mouth daily. 90 tablet 6  . fluticasone furoate-vilanterol (BREO ELLIPTA) 200-25 MCG/INH AEPB Inhale 1 puff into the lungs daily. 1 each 5  . fluticasone furoate-vilanterol (BREO ELLIPTA) 200-25 MCG/INH AEPB Inhale  1 puff into the lungs daily. 1 each 5  . HUMIRA PEN 40 MG/0.8ML PNKT Inject 40 mg into the skin once a week.    Marland Kitchen. ibuprofen (ADVIL,MOTRIN) 200 MG tablet Take 600 mg by mouth every 6 (six) hours as needed for pain or fever (alternating with tlyenol). Reported on 01/15/2015    . levocetirizine (XYZAL) 5 MG tablet Take 1 tablet (5 mg total) by mouth every evening. 90 tablet 3  . metFORMIN (GLUCOPHAGE) 1000 MG tablet Take 1 tablet (1,000 mg total) by mouth 2 (two) times daily with a meal. 180 tablet 6  . methotrexate (50 MG/ML) 1 G injection once a week.    . methylphenidate (RITALIN LA) 20 MG 24 hr capsule Take 20 mg by mouth every morning.    . Mometasone Furoate (ASMANEX HFA) 200 MCG/ACT AERO Inhale 2 puffs  into the lungs 2 (two) times daily. Rinse, gargle, and spit after use. 1 Inhaler 5  . Multiple Vitamin (MULTI-VITAMINS) TABS 1 tablet.    Marland Kitchen. NOVOFINE 32G X 6 MM MISC USE 1 NEEDLE DAILY 100 each 6  . Olopatadine HCl (PATADAY) 0.2 % SOLN Use one drop in each eye once daily as needed. 2.5 mL 5  . pantoprazole (PROTONIX) 40 MG tablet Take 1 tablet (40 mg total) by mouth 2 (two) times daily. 180 tablet 3  . PROAIR HFA 108 (90 Base) MCG/ACT inhaler USE 2 INHALATIONS EVERY 4 TO 6 HOURS AS NEEDED FOR COUGH OR WHEEZE, CAN USE 2 INHALATIONS 10 TO 20 MINUTES PRIOR TO EXERCISE 1 Inhaler 1  . SYNTHROID 125 MCG tablet Take 1 tablet (125 mcg total) by mouth daily. 90 tablet 3  . tizanidine (ZANAFLEX) 6 MG capsule Take 6 mg by mouth 3 (three) times daily.    . traMADol (ULTRAM) 50 MG tablet Reported on 01/15/2015  0   No current facility-administered medications for this visit.     Family History  Problem Relation Age of Onset  . Diabetes Mother   . Hyperlipidemia Father   . Hypertension Father   . Diabetes Father   . Colon cancer Father   . Cholelithiasis Maternal Grandmother   . Diabetes Maternal Grandmother   . Cervical cancer Maternal Grandmother   . Stroke Maternal Grandmother   . Cancer Maternal Grandfather   . Diabetes Maternal Grandfather   . Stroke Maternal Grandfather   . Diabetes Paternal Grandmother   . Diabetes Paternal Grandfather   . Thyroid disease Neg Hx     ROS:  Pertinent items are noted in HPI.  Otherwise, a comprehensive ROS was negative.  Exam:   BP 136/90 (BP Location: Right Arm, Patient Position: Sitting, Cuff Size: Large)   Pulse 64   Ht 5' 6.75" (1.695 m)   Wt 244 lb (110.7 kg)   LMP 01/07/2016 (Exact Date)   BMI 38.50 kg/m  Height: 5' 6.75" (169.5 cm) Ht Readings from Last 3 Encounters:  01/14/16 5' 6.75" (1.695 m)  10/03/15 5' 6.75" (1.695 m)  01/18/15 5' 6.75" (1.695 m)    General appearance: alert, cooperative and appears stated age Head: Normocephalic,  without obvious abnormality, atraumatic Neck: no adenopathy, supple, symmetrical, trachea midline and thyroid normal to inspection and palpation Lungs: clear to auscultation bilaterally Breasts: normal appearance, no masses or tenderness Heart: regular rate and rhythm Abdomen: soft, non-tender; no masses,  no organomegaly Extremities: extremities normal, atraumatic, no cyanosis or edema Skin: Skin color, texture, turgor normal. No rashes or lesions Lymph nodes: Cervical, supraclavicular, and axillary nodes  normal. No abnormal inguinal nodes palpated Neurologic: Grossly normal   Pelvic: External genitalia:  no lesions              Urethra:  normal appearing urethra with no masses, tenderness or lesions              Bartholin's and Skene's: normal                 Vagina: normal appearing vagina with normal color and discharge, no lesions              Cervix: anteverted, IUD strings are visible              Pap taken: Yes.   Bimanual Exam:  Uterus:  normal size, contour, position, consistency, mobility, non-tender              Adnexa: no mass, fullness, tenderness               Rectovaginal: Confirms               Anus:  normal sphincter tone, no lesions  Chaperone present: yes  A:  Well Woman with normal exam  Never SA  Menorrhagia history Skyla IUD working well for cycle control             Due for removal 2/18 - wants earlier             Hypertension, asthma, Hypothyroid/Type 2 diabetes/cholesterol and joint problem with MD management    P:   Reviewed health and wellness pertinent to exam  Pap smear as above  Pt is away at college and prefers to get IUD changed before she has to go back on 01/31/16.  Will try and get [facility]  Counseled on breast self exam, STD prevention, adequate intake of calcium and vitamin D, diet and exercise return annually or prn  An After Visit Summary was printed and given to the patient.

## 2016-01-15 LAB — CBC WITH DIFFERENTIAL/PLATELET
BASOS PCT: 1 %
Basophils Absolute: 82 cells/uL (ref 0–200)
Eosinophils Absolute: 574 cells/uL — ABNORMAL HIGH (ref 15–500)
Eosinophils Relative: 7 %
HEMATOCRIT: 34.9 % — AB (ref 35.0–45.0)
HEMOGLOBIN: 11.1 g/dL — AB (ref 11.7–15.5)
LYMPHS ABS: 1886 {cells}/uL (ref 850–3900)
Lymphocytes Relative: 23 %
MCH: 25.7 pg — ABNORMAL LOW (ref 27.0–33.0)
MCHC: 31.8 g/dL — ABNORMAL LOW (ref 32.0–36.0)
MCV: 80.8 fL (ref 80.0–100.0)
MONO ABS: 902 {cells}/uL (ref 200–950)
MPV: 10.1 fL (ref 7.5–12.5)
Monocytes Relative: 11 %
NEUTROS PCT: 58 %
Neutro Abs: 4756 cells/uL (ref 1500–7800)
Platelets: 315 10*3/uL (ref 140–400)
RBC: 4.32 MIL/uL (ref 3.80–5.10)
RDW: 16.3 % — ABNORMAL HIGH (ref 11.0–15.0)
WBC: 8.2 10*3/uL (ref 3.8–10.8)

## 2016-01-15 LAB — IPS PAP TEST WITH REFLEX TO HPV

## 2016-01-16 LAB — TSH: TSH: 0.98 mIU/L

## 2016-01-16 LAB — HEMOGLOBIN A1C
Hgb A1c MFr Bld: 5.4 % (ref <5.7)
Mean Plasma Glucose: 108 mg/dL

## 2016-01-16 LAB — COMPREHENSIVE METABOLIC PANEL
ALBUMIN: 3.8 g/dL (ref 3.6–5.1)
ALK PHOS: 34 U/L (ref 33–115)
ALT: 16 U/L (ref 6–29)
AST: 14 U/L (ref 10–30)
BILIRUBIN TOTAL: 0.4 mg/dL (ref 0.2–1.2)
BUN: 10 mg/dL (ref 7–25)
CALCIUM: 8.8 mg/dL (ref 8.6–10.2)
CO2: 26 mmol/L (ref 20–31)
Chloride: 104 mmol/L (ref 98–110)
Creat: 0.62 mg/dL (ref 0.50–1.10)
Glucose, Bld: 84 mg/dL (ref 70–99)
Potassium: 4.4 mmol/L (ref 3.5–5.3)
Sodium: 139 mmol/L (ref 135–146)
TOTAL PROTEIN: 6.6 g/dL (ref 6.1–8.1)

## 2016-01-16 LAB — LIPID PANEL
Cholesterol: 143 mg/dL (ref <200)
HDL: 57 mg/dL (ref >50)
LDL Cholesterol: 69 mg/dL (ref <100)
TRIGLYCERIDES: 83 mg/dL (ref <150)
Total CHOL/HDL Ratio: 2.5 Ratio (ref <5.0)
VLDL: 17 mg/dL (ref <30)

## 2016-01-16 LAB — T4, FREE: Free T4: 1.4 ng/dL (ref 0.8–1.8)

## 2016-01-16 LAB — VITAMIN D 25 HYDROXY (VIT D DEFICIENCY, FRACTURES): VIT D 25 HYDROXY: 25 ng/mL — AB (ref 30–100)

## 2016-01-17 ENCOUNTER — Other Ambulatory Visit: Payer: Self-pay | Admitting: Pediatric Endocrinology

## 2016-01-17 ENCOUNTER — Ambulatory Visit (INDEPENDENT_AMBULATORY_CARE_PROVIDER_SITE_OTHER): Payer: 59 | Admitting: Family

## 2016-01-17 ENCOUNTER — Encounter (INDEPENDENT_AMBULATORY_CARE_PROVIDER_SITE_OTHER): Payer: Self-pay | Admitting: Family

## 2016-01-17 VITALS — BP 130/84 | HR 60 | Wt 242.4 lb

## 2016-01-17 DIAGNOSIS — E559 Vitamin D deficiency, unspecified: Secondary | ICD-10-CM | POA: Diagnosis not present

## 2016-01-17 DIAGNOSIS — E038 Other specified hypothyroidism: Secondary | ICD-10-CM | POA: Diagnosis not present

## 2016-01-17 DIAGNOSIS — E782 Mixed hyperlipidemia: Secondary | ICD-10-CM | POA: Diagnosis not present

## 2016-01-17 DIAGNOSIS — E119 Type 2 diabetes mellitus without complications: Secondary | ICD-10-CM | POA: Diagnosis not present

## 2016-01-17 DIAGNOSIS — L83 Acanthosis nigricans: Secondary | ICD-10-CM | POA: Diagnosis not present

## 2016-01-17 DIAGNOSIS — R1013 Epigastric pain: Secondary | ICD-10-CM | POA: Diagnosis not present

## 2016-01-17 MED ORDER — SYNTHROID 125 MCG PO TABS: 125.0000 ug | ORAL_TABLET | Freq: Every day | ORAL | 3 refills | Status: DC

## 2016-01-17 MED ORDER — EZETIMIBE 10 MG PO TABS: 10.0000 mg | ORAL_TABLET | Freq: Every day | ORAL | 6 refills | Status: DC

## 2016-01-17 MED ORDER — PANTOPRAZOLE SODIUM 40 MG PO TBEC: 40.0000 mg | DELAYED_RELEASE_TABLET | Freq: Two times a day (BID) | ORAL | 3 refills | Status: DC

## 2016-01-17 MED ORDER — METFORMIN HCL 1000 MG PO TABS: 1000.0000 mg | ORAL_TABLET | Freq: Two times a day (BID) | ORAL | 6 refills | Status: DC

## 2016-01-17 NOTE — Patient Instructions (Addendum)
Continue Metformin  Continue Zetia  Continue Protonix  Exercise daily  Eat healthy well balanced diet   - [facility] refer to adult [facility] at [facility] on [address]

## 2016-01-17 NOTE — Progress Notes (Signed)
Subjective:  Subjective  Patient Name: Valerie Marquez Date of Birth: 02/23/1995  MRN: [id number]  Valerie Marquez  presents to the office today for follow-up evaluation and management of her obesity, acanthosis, goiter, hypothyroidism, thyroiditis, hyperlipidemia, dyspepsia, prediabetes, hypertension, and abnormal liver function tests.  HISTORY OF PRESENT ILLNESS:   Valerie Marquez is a 20 y.o. Caucasian female   Valerie Marquez was accompanied by her self  1. Valerie Marquez was first referred to our clinic on 05/15/04 by her pediatrician, Dr. Aggie Hacker, for evaluation and management of obesity, hyperlipidemia, GERD, abnormal TSH, hunger pains, and asthma.  She was 99-1/21 years of age.  She was a preterm infant from a pregnancy complicated by gestational diabetes. She had acanthosis apparent prior to her first visit.  She began to develop  breasts at age 40. She also developed axillary hair and pubic hair at the same time. Family history was positive for obesity and type 2 diabetes on both sides of the family. There was no known thyroid disease. Lab data from 04/23/04 showed a hemoglobin A1c of 5.4%. CMP was normal. TSH was 3.53. T4 was 6.8. Cholesterol was 199. Laboratory data on 05/16/04 showed a TSH of 3.996, free T4 1.14, and free T3 of 4.0. FSH was 0.7. Her LH was less than 0.1. Her testosterone was 37.96. Her estradiol was less than 10. DHEAS was 56. Her androstenedione was 0.4. Her fasting glucose was 91. Her fasting insulin was 38, which placed her in the hyperinsulinemia range. Her 24-hour urine free cortisol value was 7.7 (normal less than 37). She did not have any evidence for Cushing's disease. It appeared that she had the misfortune to inherit genetics for obesity and type 2 diabetes from both sides of her family. Part of the cause of her weight gain has been episodic treatment with steroids for her asthma. In late 2006, when her TSH values remain elevated, we started her on Synthroid, 25 mcg per day. Since  then, we have gradually increased her Synthroid dose to 125 mcg per day. Because her dyspepsia was a major stimulus for weight gain, we started treating her with Prevacid back in 2007. She's continued on her metformin as well. In 2009 we started her on Zetia, 10 mg per day, which resulted improvement in her cholesterol, LDL, and triglycerides per.    2. The patient's last [facility] visit was on 01/15/15. In the interim, she has been generally healthy.  She reports that things have been going pretty well since her last visit, she feels like her health has improved overall. 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 She lives on campus and eats cafeteria food but she has her meals planned by the athletic team at her college. She only drinks sugar drinks when she is playing lacrosse, she will drink Gatorade.   She is taking 1,000mg  of Metformin twice per day. She reports that she never misses any doses of it. She takes it before breakfast and before dinner. She denies upset stomach and diarrhea. She is not checking her blood sugars.   She is taking 10mg  of Zetia every day. She is not missing any doses. She reports that she has worked hard to avoid eating fried foods and foods that are high in cholesterol.   She takes of Synthroid daily, she takes it first thing in the morning. She denies fatigue, cold intolerance and constipation. She feels like she has good energy currently.   She is taking 40mg   of Protonix twice daily. She reports that she is doing very well on protonix and is not having as much reflux or abdominal pain.   Valerie Marquez states that she is ready to transition to adult endocrinology. Her mother told her that this would be the appropriate time to get a referral while she is in town. She is fine with starting at endocrinology in [place].     3. Pertinent Review of Systems:  Constitutional: The patient feels "great". The patient seems healthy and happy.   Head and Eyes: denies any headaches. Vision seems to be good. There are no recognized eye problems. Wears glasses. Neck: The patient has no complaints of anterior neck swelling, soreness, tenderness, pressure, discomfort, or difficulty swallowing.   Heart: Heart rate increases with exercise or other physical activity. The patient has no complaints of palpitations, irregular heart beats, chest pain, or chest pressure.   Gastrointestinal: Bowel movents seem normal. The patient has no complaints of excessive hunger, acid reflux, upset stomach, stomach aches or pains, diarrhea, or constipation. She gets full really about halfway through me  Legs: Muscle mass and strength seem normal. There are no complaints of numbness, tingling, burning, or pain. No edema is noted.  Feet: There are no obvious foot problems. There are no complaints of numbness, tingling, burning, or pain. No edema is noted.  Neurologic: There are no recognized problems with muscle movement and strength, sensation, or coordination. GYN/GU: periods are normal  PAST MEDICAL, FAMILY, AND SOCIAL HISTORY  Past Medical History:  Diagnosis Date  . Abnormal liver function tests   . Abnormal thyroid function test   . Acanthosis   . Allergic rhinitis   . Asthma   . Combined hyperlipidemia   . Concussion   . Diabetes mellitus (HCC)   . Diabetes mellitus without complication (HCC)   . Dyspepsia   . Eczema   . Erythema nodosum   . Gastroesophageal reflux   . Goiter    hypothyroidsm--hashimoto's  . Hypertension   . Iron deficiency anemia   . Lactose intolerance   . Obesity   . Prediabetes   . Psoriatic arthritis (HCC)     Family History  Problem Relation Age of Onset  . Diabetes Mother   . Lupus Mother   . Hyperlipidemia Father   . Hypertension Father   . Diabetes Father   . Colon cancer Father   . Thyroid disease Brother   . Eczema Brother   . Hyperlipidemia Brother   . Diabetes Brother     pre diabetic  .  Cholelithiasis Maternal Grandmother   . Diabetes Maternal Grandmother   . Cervical cancer Maternal Grandmother   . Stroke Maternal Grandmother   . Cancer Maternal Grandfather   . Diabetes Maternal Grandfather   . Stroke Maternal Grandfather   . Diabetes Paternal Grandmother   . Diabetes Paternal Grandfather      Current Outpatient Prescriptions:  .  acetaminophen (TYLENOL) 500 MG tablet, Take 1,000 mg by mouth every 4 (four) hours as needed for fever (alternating with ibuprofen). Reported on 01/15/2015, Disp: , Rfl:  .  albuterol (PROAIR HFA) 108 (90 Base) MCG/ACT inhaler, Inhale two puffs every four to six hours as needed for cough or wheeze., Disp: 3 Inhaler, Rfl: 0 .  azelastine (OPTIVAR) 0.05 % ophthalmic solution, Place 1 drop into both eyes 2 (two) times daily., Disp: 6 mL, Rfl: 5 .  B Complex-C (B-COMPLEX WITH VITAMIN C) tablet, Take 1 tablet by mouth 2 (two) times daily., Disp: ,  Rfl:  .  calcium-vitamin D (CALCIUM 500/D) 500-200 MG-UNIT per tablet, 1 tablet., Disp: , Rfl:  .  desonide (DESOWEN) 0.05 % cream, Apply to affected areas once daily as needed., Disp: 60 g, Rfl: 5 .  EPINEPHrine (EPIPEN 2-PAK) 0.3 mg/0.3 mL IJ SOAJ injection, Use as directed for life-threatening allergic reaction., Disp: 2 Device, Rfl: 3 .  ergocalciferol (VITAMIN D2) 50000 UNITS capsule, Take 1 capsule once a week for 12 weeks, Disp: 12 capsule, Rfl: 2 .  ezetimibe (ZETIA) 10 MG tablet, Take 1 tablet (10 mg total) by mouth daily., Disp: 90 tablet, Rfl: 6 .  fluticasone furoate-vilanterol (BREO ELLIPTA) 200-25 MCG/INH AEPB, Inhale 1 puff into the lungs daily. Rinse, gargle, and spit after use., Disp: 180 each, Rfl: 0 .  HUMIRA PEN 40 MG/0.8ML PNKT, Inject 40 mg into the skin once a week., Disp: , Rfl:  .  ibuprofen (ADVIL,MOTRIN) 200 MG tablet, Take 600 mg by mouth every 6 (six) hours as needed for pain or fever (alternating with tlyenol). Reported on 01/15/2015, Disp: , Rfl:  .  levocetirizine (XYZAL) 5 MG  tablet, Take one tablet by mouth every evening if needed., Disp: 90 tablet, Rfl: 0 .  metFORMIN (GLUCOPHAGE) 1000 MG tablet, Take 1 tablet (1,000 mg total) by mouth 2 (two) times daily with a meal., Disp: 180 tablet, Rfl: 6 .  methotrexate (50 MG/ML) 1 G injection, once a week., Disp: , Rfl:  .  methylphenidate (RITALIN LA) 20 MG 24 hr capsule, Take 20 mg by mouth every morning., Disp: , Rfl:  .  Mometasone Furoate (ASMANEX HFA) 200 MCG/ACT AERO, Inhale 2 puffs into the lungs 2 (two) times daily. Rinse, gargle, and spit after use., Disp: 1 Inhaler, Rfl: 5 .  Multiple Vitamin (MULTI-VITAMINS) TABS, 1 tablet., Disp: , Rfl:  .  NOVOFINE 32G X 6 MM MISC, USE 1 NEEDLE DAILY, Disp: 100 each, Rfl: 6 .  Olopatadine HCl (PATADAY) 0.2 % SOLN, Use one drop in each eye once daily as needed., Disp: 7.5 mL, Rfl: 0 .  pantoprazole (PROTONIX) 40 MG tablet, Take 1 tablet (40 mg total) by mouth 2 (two) times daily., Disp: 180 tablet, Rfl: 3 .  SYNTHROID 125 MCG tablet, Take 1 tablet (125 mcg total) by mouth daily., Disp: 90 tablet, Rfl: 3 .  tizanidine (ZANAFLEX) 6 MG capsule, Take 6 mg by mouth 3 (three) times daily., Disp: , Rfl:  .  traMADol (ULTRAM) 50 MG tablet, Reported on 01/15/2015, Disp: , Rfl: 0 .  albuterol (PROVENTIL) (2.5 MG/3ML) 0.083% nebulizer solution, Take 2.5 mg by nebulization every 6 (six) hours as needed for wheezing. Reported on 01/15/2015, Disp: , Rfl:   Allergies as of 01/17/2016 - Review Complete 01/17/2016  Allergen Reaction Noted  . Corn-containing products  05/09/2012  . Peanut-containing drug products  05/09/2012  . Amoxicillin Nausea And Vomiting and Rash 06/30/2010  . Other Rash 04/21/2013     reports that she has never smoked. She has never used smokeless tobacco. She reports that she does not drink alcohol or use drugs. Pediatric History  Patient Guardian Status  . Mother:  Marcellina MillinHanlon,Dana   Other Topics Concern  . Not on file   Social History Narrative   Dad deceased 1/14.     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   Primary Care Provider: Beverely LowSUMNER,BRIAN A, MD  ROS: There are no other significant problems involving Kelee's other body systems.    Objective:  Objective  Vital Signs:  BP 130/84   Pulse 60   Wt 242 lb 6.4 oz (110 kg)   LMP 01/07/2016 (Exact Date)   BMI 38.25 kg/m     Ht Readings from Last 3 Encounters:  01/14/16 5' 6.75" (1.695 m)  10/03/15 5' 6.75" (1.695 m)  01/18/15 5' 6.75" (1.695 m)   Wt Readings from Last 3 Encounters:  01/17/16 242 lb 6.4 oz (110 kg)  01/14/16 244 lb (110.7 kg)  10/03/15 229 lb (103.9 kg)   HC Readings from Last 3 Encounters:  No data found for [facility]   Body surface area is 2.28 meters squared. Facility age limit for growth percentiles is 20 years. Facility age limit for growth percentiles is 20 years.    PHYSICAL EXAM:  Constitutional: The patient appears healthy and well nourished. The patient's height and weight are advanced for age.  Head: The head is normocephalic. Face: The face appears normal  Eyes: The eyes appear to be normally formed and spaced. Gaze is conjugate. There is no obvious arcus or proptosis. Moisture appears normal. Ears: The ears are normally placed and appear externally normal.  Mouth: The oropharynx and tongue appear normal except for yellow film on tongue. Dentition appears to be normal for age. Oral moisture is normal. Neck: The neck appears to be visibly normal. The thyroid gland is normal in size. The consistency of the thyroid gland is normal. The thyroid gland is not tender to palpation. Acanthosis is present Lungs: The lungs are clear to auscultation. Air movement is good. Heart: Heart rate and rhythm are regular. Heart sounds S1 and S2 are normal. I did not appreciate any pathologic cardiac murmurs. Abdomen: The abdomen appears to be large in size for the patient's age. Bowel sounds are normal.  There is no obvious hepatomegaly, splenomegaly, or other mass effect. +stretch marks Arms: Muscle size and bulk are normal for age. Hands: There is no obvious tremor. Phalangeal and metacarpophalangeal joints are normal. Legs: Muscles appear normal for age. No edema is present. Feet: Feet are normally formed.  GYN/GU: deferred  LAB DATA:       Assessment and Plan:  Assessment  ASSESSMENT:  1. Hypothyroidism- clinically and chemically euthyroid. Goiter not present today.. 2. Type 2 diabetes- A1C down to 5.4% today from 5.7 at last visit. Continues on metformin and continues to increase exercise now that she is playing lacrosse daily in college. Has made positive diet changes.  3. Hyperlipidemia- on Zetia. Lipid panel improved.  4. GERD: Continues on Protonix 40mg  twice daily. Followed by GI.  5. Vitamin D deficiency: Start vitamin D supplement OTC.   PLAN:  1. Diagnostic: Labs as above. Discussed labs in detail with patient.  2. Therapeutic: Continue synthroid, zetia, and metformin along with pantoprazole 40 mg BID as seems to be helping nausea and gastric discomfort. Provided refills on all the above medications. 3. Patient education: Patient appears to be doing well overall with interdisciplinary management from our team, neurology and rheumatology. Should continue care with these specialists. Discussed A1c and commended patient on improvements she has made. Discussed importance of exercise and daily diet. Discussed transition to adult endocrinology.  4. Follow-up: 6 months with Adult Endocrine.      Gretchen Short, FNP-C

## 2016-01-21 NOTE — Progress Notes (Signed)
Encounter reviewed by Dr. Brook Amundson C. Silva.  

## 2016-02-02 ENCOUNTER — Other Ambulatory Visit: Payer: Self-pay | Admitting: Pediatric Endocrinology

## 2016-02-02 DIAGNOSIS — E119 Type 2 diabetes mellitus without complications: Secondary | ICD-10-CM

## 2016-02-06 ENCOUNTER — Ambulatory Visit: Payer: 59 | Admitting: Obstetrics and Gynecology

## 2016-02-10 ENCOUNTER — Other Ambulatory Visit: Payer: Self-pay | Admitting: Pediatric Endocrinology

## 2016-02-10 DIAGNOSIS — E782 Mixed hyperlipidemia: Secondary | ICD-10-CM

## 2016-03-20 ENCOUNTER — Other Ambulatory Visit: Payer: Self-pay | Admitting: Pediatrics

## 2016-03-20 NOTE — Telephone Encounter (Signed)
Endo

## 2016-03-31 ENCOUNTER — Encounter (INDEPENDENT_AMBULATORY_CARE_PROVIDER_SITE_OTHER): Payer: Self-pay | Admitting: Family

## 2016-03-31 ENCOUNTER — Ambulatory Visit (INDEPENDENT_AMBULATORY_CARE_PROVIDER_SITE_OTHER): Payer: 59 | Admitting: Family

## 2016-03-31 ENCOUNTER — Telehealth: Payer: Self-pay | Admitting: Obstetrics and Gynecology

## 2016-03-31 ENCOUNTER — Encounter (INDEPENDENT_AMBULATORY_CARE_PROVIDER_SITE_OTHER): Payer: Self-pay | Admitting: *Deleted

## 2016-03-31 VITALS — BP 130/70 | HR 100 | Ht 66.54 in | Wt 244.6 lb

## 2016-03-31 DIAGNOSIS — I158 Other secondary hypertension: Secondary | ICD-10-CM | POA: Diagnosis not present

## 2016-03-31 DIAGNOSIS — R7303 Prediabetes: Secondary | ICD-10-CM | POA: Diagnosis not present

## 2016-03-31 DIAGNOSIS — E559 Vitamin D deficiency, unspecified: Secondary | ICD-10-CM | POA: Diagnosis not present

## 2016-03-31 DIAGNOSIS — E038 Other specified hypothyroidism: Secondary | ICD-10-CM

## 2016-03-31 DIAGNOSIS — E782 Mixed hyperlipidemia: Secondary | ICD-10-CM | POA: Diagnosis not present

## 2016-03-31 LAB — POCT GLYCOSYLATED HEMOGLOBIN (HGB A1C): Hemoglobin A1C: 5.3

## 2016-03-31 LAB — GLUCOSE, POCT (MANUAL RESULT ENTRY): POC Glucose: 113 mg/dl — AB (ref 70–99)

## 2016-03-31 MED ORDER — MISOPROSTOL 200 MCG PO TABS: ORAL_TABLET | ORAL | 0 refills | Status: DC

## 2016-03-31 NOTE — Patient Instructions (Signed)
TFT today  Continue current medications.

## 2016-03-31 NOTE — Progress Notes (Signed)
Subjective:  Subjective  Patient Name: Valerie Marquez Date of Birth: 06/15/1995  MRN: [id number]  Valerie Marquez  presents to the office today for follow-up evaluation and management of her obesity, acanthosis, goiter, hypothyroidism, thyroiditis, hyperlipidemia, dyspepsia, prediabetes, hypertension, and abnormal liver function tests.  HISTORY OF PRESENT ILLNESS:   Valerie Marquez is a 21 y.o. Caucasian female   Valerie Marquez was accompanied by her self  1. Valerie Marquez was first referred to our clinic on 05/15/04 by her pediatrician, Dr. Aggie Hacker, for evaluation and management of obesity, hyperlipidemia, GERD, abnormal TSH, hunger pains, and asthma.  [age] was 71-1/22 years of age.  She was a preterm infant from a pregnancy complicated by gestational diabetes. She had acanthosis apparent prior to her first visit.  She began to develop  breasts at age 40. She also developed axillary hair and pubic hair at the same time. Family history was positive for obesity and type 2 diabetes on both sides of the family. There was no known thyroid disease. Lab data from 04/23/04 showed a hemoglobin A1c of 5.4%. CMP was normal. TSH was 3.53. T4 was 6.8. Cholesterol was 199. Laboratory data on 05/16/04 showed a TSH of 3.996, free T4 1.14, and free T3 of 4.0. FSH was 0.7. Her LH was less than 0.1. Her testosterone was 37.96. Her estradiol was less than 10. DHEAS was 56. Her androstenedione was 0.4. Her fasting glucose was 91. Her fasting insulin was 38, which placed her in the hyperinsulinemia range. Her 24-hour urine free cortisol value was 7.7 (normal less than 37). She did not have any evidence for Cushing's disease. It appeared that she had the misfortune to inherit genetics for obesity and type 2 diabetes from both sides of her family. Part of the cause of her weight gain has been episodic treatment with steroids for her asthma. In late 2006, when her TSH values remain elevated, we started her on Synthroid, 25 mcg per day. Since  then, we have gradually increased her Synthroid dose to 125 mcg per day. Because her dyspepsia was a major stimulus for weight gain, we started treating her with Prevacid back in 2007. She's continued on her metformin as well. In 2009 we started her on Zetia, 10 mg per day, which resulted improvement in her cholesterol, LDL, and triglycerides per.    2. The patient's last [facility] visit was on 01/17/16. In the interim, she has been generally healthy.  Valerie Marquez presents today because "my mom freaked out". Valerie Marquez had a Lupus flare and was told by her physician that she also needs to have her thyroid labs checked due the the flare. Valerie Marquez had been tired and not feeling well due to her Lupus exacerbation and her mom became very nervous. Mom is also having a heart cath done today so she wanted to make sure everyone was taken care of before hand. Valerie Marquez states that she is feeling much better and is going to play in her [employer]. She just needs her thyroid labs checked. She states that she is still taking all her medications as prescribed.    3. Pertinent Review of Systems:  Constitutional: The patient feels "a little tired but good". The patient seems healthy and happy.  Head and Eyes: denies any headaches. Vision seems to be good. There are no recognized eye problems. Wears glasses. Neck: The patient has no complaints of anterior neck swelling, soreness, tenderness, pressure, discomfort, or difficulty swallowing.   Heart: Heart rate increases with exercise or other physical activity. The patient  has no complaints of palpitations, irregular heart beats, chest pain, or chest pressure.   Gastrointestinal: Bowel movents seem normal. The patient has no complaints of excessive hunger, acid reflux, upset stomach, stomach aches or pains, diarrhea, or constipation. She gets full really about halfway through me  Legs: Muscle mass and strength seem normal. There are no complaints of numbness, tingling, burning, or pain. No  edema is noted.  Feet: There are no obvious foot problems. There are no complaints of numbness, tingling, burning, or pain. No edema is noted.  Neurologic: There are no recognized problems with muscle movement and strength, sensation, or coordination. GYN/GU: periods are normal  PAST MEDICAL, FAMILY, AND SOCIAL HISTORY  Past Medical History:  Diagnosis Date  . Abnormal liver function tests   . Abnormal thyroid function test   . Acanthosis   . Allergic rhinitis   . Asthma   . Combined hyperlipidemia   . Concussion   . Diabetes mellitus (HCC)   . Diabetes mellitus without complication (HCC)   . Dyspepsia   . Eczema   . Erythema nodosum   . Gastroesophageal reflux   . Goiter    hypothyroidsm--hashimoto's  . Hypertension   . Iron deficiency anemia   . Lactose intolerance   . Obesity   . Prediabetes   . Psoriatic arthritis (HCC)     Family History  Problem Relation Age of Onset  . Diabetes Mother   . Lupus Mother   . Hyperlipidemia Father   . Hypertension Father   . Diabetes Father   . Colon cancer Father   . Thyroid disease Brother   . Eczema Brother   . Hyperlipidemia Brother   . Diabetes Brother     pre diabetic  . Cholelithiasis Maternal Grandmother   . Diabetes Maternal Grandmother   . Cervical cancer Maternal Grandmother   . Stroke Maternal Grandmother   . Cancer Maternal Grandfather   . Diabetes Maternal Grandfather   . Stroke Maternal Grandfather   . Diabetes Paternal Grandmother   . Diabetes Paternal Grandfather      Current Outpatient Prescriptions:  .  B Complex-C (B-COMPLEX WITH VITAMIN C) tablet, Take 1 tablet by mouth 2 (two) times daily., Disp: , Rfl:  .  calcium-vitamin D (CALCIUM 500/D) 500-200 MG-UNIT per tablet, 1 tablet., Disp: , Rfl:  .  desonide (DESOWEN) 0.05 % cream, Apply to affected areas once daily as needed., Disp: 60 g, Rfl: 5 .  ezetimibe (ZETIA) 10 MG tablet, Take 1 tablet (10 mg total) by mouth daily., Disp: 90 tablet, Rfl: 6 .   fluticasone furoate-vilanterol (BREO ELLIPTA) 200-25 MCG/INH AEPB, Inhale 1 puff into the lungs daily. Rinse, gargle, and spit after use., Disp: 180 each, Rfl: 0 .  HUMIRA PEN 40 MG/0.8ML PNKT, Inject 40 mg into the skin once a week., Disp: , Rfl:  .  levocetirizine (XYZAL) 5 MG tablet, Take one tablet by mouth every evening if needed., Disp: 90 tablet, Rfl: 0 .  metFORMIN (GLUCOPHAGE) 1000 MG tablet, TAKE 1 TABLET TWICE A DAY WITH A MEAL, Disp: 180 tablet, Rfl: 6 .  methylphenidate (RITALIN LA) 20 MG 24 hr capsule, Take 20 mg by mouth every morning., Disp: , Rfl:  .  Multiple Vitamin (MULTI-VITAMINS) TABS, 1 tablet., Disp: , Rfl:  .  NOVOFINE 32G X 6 MM MISC, USE 1 NEEDLE DAILY, Disp: 100 each, Rfl: 6 .  Olopatadine HCl (PATADAY) 0.2 % SOLN, Use one drop in each eye once daily as needed., Disp: 7.5 mL,  Rfl: 0 .  pantoprazole (PROTONIX) 40 MG tablet, Take 1 tablet (40 mg total) by mouth 2 (two) times daily., Disp: 180 tablet, Rfl: 3 .  SYNTHROID 125 MCG tablet, TAKE 1 TABLET DAILY, Disp: 90 tablet, Rfl: 3 .  tizanidine (ZANAFLEX) 6 MG capsule, Take 6 mg by mouth 3 (three) times daily., Disp: , Rfl:  .  traMADol (ULTRAM) 50 MG tablet, Reported on 01/15/2015, Disp: , Rfl: 0 .  Vitamin D, Ergocalciferol, (DRISDOL) 50000 units CAPS capsule, take 1 capsule by mouth ONCE A WEEK, Disp: 12 capsule, Rfl: 0 .  acetaminophen (TYLENOL) 500 MG tablet, Take 1,000 mg by mouth every 4 (four) hours as needed for fever (alternating with ibuprofen). Reported on 01/15/2015, Disp: , Rfl:  .  albuterol (PROAIR HFA) 108 (90 Base) MCG/ACT inhaler, Inhale two puffs every four to six hours as needed for cough or wheeze. (Patient not taking: Reported on 03/31/2016), Disp: 3 Inhaler, Rfl: 0 .  albuterol (PROVENTIL) (2.5 MG/3ML) 0.083% nebulizer solution, Take 2.5 mg by nebulization every 6 (six) hours as needed for wheezing. Reported on 01/15/2015, Disp: , Rfl:  .  azelastine (OPTIVAR) 0.05 % ophthalmic solution, Place 1 drop  into both eyes 2 (two) times daily. (Patient not taking: Reported on 03/31/2016), Disp: 6 mL, Rfl: 5 .  EPINEPHrine (EPIPEN 2-PAK) 0.3 mg/0.3 mL IJ SOAJ injection, Use as directed for life-threatening allergic reaction. (Patient not taking: Reported on 03/31/2016), Disp: 2 Device, Rfl: 3 .  ibuprofen (ADVIL,MOTRIN) 200 MG tablet, Take 600 mg by mouth every 6 (six) hours as needed for pain or fever (alternating with tlyenol). Reported on 01/15/2015, Disp: , Rfl:  .  methotrexate (50 MG/ML) 1 G injection, once a week., Disp: , Rfl:  .  Mometasone Furoate (ASMANEX HFA) 200 MCG/ACT AERO, Inhale 2 puffs into the lungs 2 (two) times daily. Rinse, gargle, and spit after use. (Patient not taking: Reported on 03/31/2016), Disp: 1 Inhaler, Rfl: 5  Allergies as of 03/31/2016 - Review Complete 03/31/2016  Allergen Reaction Noted  . Corn-containing products  05/09/2012  . Peanut-containing drug products  05/09/2012  . Amoxicillin Nausea And Vomiting and Rash 06/30/2010  . Other Rash 04/21/2013     reports that she has never smoked. She has never used smokeless tobacco. She reports that she does not drink alcohol or use drugs. Pediatric History  Patient Guardian Status  . Mother:  Bhavya, Eschete   Other Topics Concern  . Not on file   Social History Narrative   Dad deceased 02/15/2022.    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   Primary Care Provider: Beverely Low, MD  ROS: There are no other significant problems involving Mihaela's other body systems.    Objective:  Objective  Vital Signs:  BP 130/70   Pulse 100   Ht 5' 6.53" (1.69 m)   Wt 244 lb 9.6 oz (110.9 kg)   BMI 38.85 kg/m     Ht Readings from Last 3 Encounters:  03/31/16 5' 6.53" (1.69 m)  01/14/16 5' 6.75" (1.695 m)  10/03/15 5' 6.75" (1.695 m)   Wt Readings from Last 3 Encounters:  03/31/16 244 lb 9.6 oz (110.9 kg)  01/17/16 242 lb 6.4 oz (110 kg)  01/14/16  244 lb (110.7 kg)   HC Readings from Last 3 Encounters:  No data found for [facility]   Body surface area is 2.28 meters squared. Facility age limit for growth percentiles is 20 years. Facility  age limit for growth percentiles is 20 years.    PHYSICAL EXAM:  Constitutional: The patient appears healthy and well nourished. The patient's height and weight are advanced for age.  Head: The head is normocephalic. Face: The face appears normal  Eyes: The eyes appear to be normally formed and spaced. Gaze is conjugate. There is no obvious arcus or proptosis. Moisture appears normal. Ears: The ears are normally placed and appear externally normal.  Mouth: The oropharynx and tongue appear normal except for yellow film on tongue. Dentition appears to be normal for age. Oral moisture is normal. Neck: The neck appears to be visibly normal. The thyroid gland is normal in size. The consistency of the thyroid gland is normal. The thyroid gland is not tender to palpation. Acanthosis is present Lungs: The lungs are clear to auscultation. Air movement is good. Heart: Heart rate and rhythm are regular. Heart sounds S1 and S2 are normal. I did not appreciate any pathologic cardiac murmurs. Abdomen: The abdomen appears to be large in size for the patient's age. Bowel sounds are normal. There is no obvious hepatomegaly, splenomegaly, or other mass effect. +stretch marks Arms: Muscle size and bulk are normal for age. Hands: There is no obvious tremor. Phalangeal and metacarpophalangeal joints are normal. Legs: Muscles appear normal for age. No edema is present. Feet: Feet are normally formed.  GYN/GU: deferred  LAB DATA:       Assessment and Plan:  Assessment  ASSESSMENT:  1. Hypothyroidism- clinically hypothyroid. Will repeat TFT today. Continue 125 mcg of Synthroid.  2. Type 2 diabetes- Continue Metformin 1000 mg twice per day. Continue daily exercise.    3. Hyperlipidemia- on Zetia. Lipid panel improved at  last check.  4. GERD: Continues on Protonix 40mg  twice daily. Followed by GI.  5. Vitamin D deficiency: Continue vitamin D supplement OTC.   PLAN:  1. Diagnostic: TFTs today . Therapeutic: Continue synthroid, zetia, and metformin along with pantoprazole 40 mg BID as seems to be helping nausea and gastric discomfort.  3. Patient education: Patient appears to be doing well overall with interdisciplinary management from our team, neurology and rheumatology. Should continue care with these specialists. Discussed A1c and commended patient on improvements she has made. Discussed importance of exercise and daily diet. .  4. Follow-up: 6 months with Adult Endocrine. Call us as needed.      Gretchen ShortSpenser Jaiya Mooradian, FNP-C

## 2016-03-31 NOTE — Telephone Encounter (Signed)
Patient states she is to have an IUD placed.  Says the last time she was here they were going to order one and we would call her to schedule.  Pt checking on status

## 2016-03-31 NOTE — Telephone Encounter (Signed)
Spoke with patient. Patient would like to have her IUD removed and replaced. Has Skyla IUD that was placed on 02/2013. Patient is away at college playing Lacrosse. Will be available the second week in April for exchange. Patient has never been sexually active. Appointment scheduled for 05/08/2016 at 3:30 pm with Dr.Miller. Patient is agreeable to date and time. Pre procedure instructions given.  Motrin instructions given. Motrin=Advil=Ibuprofen, 800 mg one hour before appointment. Eat a meal and hydrate well before appointment. Cytotec instructions given. Place 1 tablet PV night before the procedure. Place 1 tablet PV the morning of the procedure.  Rx for Cytotec 200 mcg #2 0RF [facility].Aware if she becomes sexually active she will need to notify the office as appointment will need to be adjusted.   Dr.Miller, okay to keep as scheduled? IUD has expired, but patient has never been sexually active. Aware to call if this changes.

## 2016-04-01 ENCOUNTER — Telehealth (INDEPENDENT_AMBULATORY_CARE_PROVIDER_SITE_OTHER): Payer: Self-pay | Admitting: Family

## 2016-04-01 LAB — T3, FREE: T3, Free: 3.3 pg/mL (ref 2.3–4.2)

## 2016-04-01 LAB — TSH: TSH: 1.35 mIU/L

## 2016-04-01 LAB — T4, FREE: Free T4: 1.6 ng/dL (ref 0.8–1.8)

## 2016-04-01 NOTE — Telephone Encounter (Signed)
Thyroid labs are normal. Continue current dose of Synthroid.

## 2016-04-01 NOTE — Telephone Encounter (Signed)
Ok with instructions and plan for removal and replacement.  Ok to close encounter.

## 2016-04-03 ENCOUNTER — Other Ambulatory Visit (INDEPENDENT_AMBULATORY_CARE_PROVIDER_SITE_OTHER): Payer: Self-pay | Admitting: *Deleted

## 2016-04-03 DIAGNOSIS — E034 Atrophy of thyroid (acquired): Secondary | ICD-10-CM

## 2016-04-09 ENCOUNTER — Other Ambulatory Visit: Payer: Self-pay | Admitting: Allergy and Immunology

## 2016-04-09 NOTE — Telephone Encounter (Signed)
Patients mother has called stating that patient need [facility] three month supply.

## 2016-04-09 NOTE — Telephone Encounter (Signed)
Pt needs to make an appt before anymore refills. Courtesy refill was already given. Left message to return call.

## 2016-04-10 ENCOUNTER — Other Ambulatory Visit: Payer: Self-pay | Admitting: Pediatric Endocrinology

## 2016-04-10 DIAGNOSIS — E782 Mixed hyperlipidemia: Secondary | ICD-10-CM

## 2016-04-10 NOTE — Telephone Encounter (Signed)
I called patient back on her mobile number. Patient did not answer, so I left message advising that she would need an OV as well as that she can get Xyzal otc until then

## 2016-04-17 ENCOUNTER — Other Ambulatory Visit: Payer: Self-pay | Admitting: Allergy and Immunology

## 2016-04-17 DIAGNOSIS — L309 Dermatitis, unspecified: Secondary | ICD-10-CM

## 2016-05-08 ENCOUNTER — Ambulatory Visit (INDEPENDENT_AMBULATORY_CARE_PROVIDER_SITE_OTHER): Payer: 59 | Admitting: Obstetrics & Gynecology

## 2016-05-08 VITALS — BP 128/80 | HR 96 | Ht 66.5 in | Wt 236.0 lb

## 2016-05-08 DIAGNOSIS — Z30433 Encounter for removal and reinsertion of intrauterine contraceptive device: Secondary | ICD-10-CM

## 2016-05-08 DIAGNOSIS — N920 Excessive and frequent menstruation with regular cycle: Secondary | ICD-10-CM

## 2016-05-08 DIAGNOSIS — Z975 Presence of (intrauterine) contraceptive device: Secondary | ICD-10-CM | POA: Diagnosis not present

## 2016-05-08 LAB — POCT URINE PREGNANCY: Preg Test, Ur: NEGATIVE

## 2016-05-08 NOTE — Progress Notes (Signed)
21 y.o. G0P0000 Single Caucasian female presents for removal of Skyl IUD and re-insertion of Kyleena IUD.  She has hx of menorrhagia and iron deficiency anemia before the IUD was placed.  She had amenorrhea for several months after the Christean Grief was placed on 03/10/13.  Then her cycles changed to just light spotting but still significantly better than before the [facility] IUD was placed.  Over the last few months, her bleeding has become heavier again (not like it was before the [facility] IUD) but definitely heavier with more clotting.  IUD is overdue for removal.  She is not SA and has not been SA so there is no pregnancy risk.  Pt has also been counseled about risks and benefits as well as complications.  Consent is obtained today.  All questions answered prior to start of procedure.   Recent STD testing:  Not needed LMP:  Patient's last menstrual period was 04/27/2016.  Patient Active Problem List   Diagnosis Date Noted  . Precordial chest pain 11/13/2014  . Erythema nodosum   . Microscopic hematuria   . Vitamin D deficiency 05/24/2014  . Eczema of both hands 05/24/2014  . Menorrhagia 11/10/2012  . RUQ abdominal pain 11/09/2012  . Abnormal findings on diagnostic imaging of gall bladder 11/09/2012  . Type 2 diabetes mellitus (HCC) 04/07/2012  . Hypothyroid 01/04/2012  . Iron deficiency anemia 01/04/2012  . Acanthosis   . Goiter   . Abnormal thyroid function test   . Combined hyperlipidemia   . Dyspepsia   . Hypertension   . Abnormal transaminases   . Asthma   . Gastroesophageal reflux   . Obesity 06/30/2010  . Mixed hyperlipidemia 06/30/2010   Past Medical History:  Diagnosis Date  . Abnormal liver function tests   . Abnormal thyroid function test   . Acanthosis   . Allergic rhinitis   . Asthma   . Combined hyperlipidemia   . Concussion   . Diabetes mellitus (HCC)   . Diabetes mellitus without complication (HCC)   . Dyspepsia   . Eczema   . Erythema nodosum   . Gastroesophageal  reflux   . Goiter    hypothyroidsm--hashimoto's  . Hypertension   . Iron deficiency anemia   . Lactose intolerance   . Obesity   . Prediabetes   . Psoriatic arthritis [facility])    Current Outpatient Prescriptions on File Prior to Visit  Medication Sig Dispense Refill  . acetaminophen (TYLENOL) 500 MG tablet Take 1,000 mg by mouth every 4 (four) hours as needed for fever (alternating with ibuprofen). Reported on 01/15/2015    . albuterol (PROAIR HFA) 108 (90 Base) MCG/ACT inhaler Inhale two puffs every four to six hours as needed for cough or wheeze. 3 Inhaler 0  . albuterol (PROVENTIL) (2.5 MG/3ML) 0.083% nebulizer solution Take 2.5 mg by nebulization every 6 (six) hours as needed for wheezing. Reported on 01/15/2015    . azelastine (OPTIVAR) 0.05 % ophthalmic solution Place 1 drop into both eyes 2 (two) times daily. 6 mL 5  . B Complex-C (B-COMPLEX WITH VITAMIN C) tablet Take 1 tablet by mouth 2 (two) times daily.    . calcium-vitamin D (CALCIUM 500/D) 500-200 MG-UNIT per tablet 1 tablet.    Marland Kitchen desonide (DESOWEN) 0.05 % cream Apply to affected areas once daily as needed. 60 g 5  . ezetimibe (ZETIA) 10 MG tablet TAKE 1 TABLET DAILY 90 tablet 2  . fluticasone furoate-vilanterol (BREO ELLIPTA) 200-25 MCG/INH AEPB Inhale 1 puff into the lungs daily.  Rinse, gargle, and spit after use. 180 each 0  . ibuprofen (ADVIL,MOTRIN) 200 MG tablet Take 600 mg by mouth every 6 (six) hours as needed for pain or fever (alternating with tlyenol). Reported on 01/15/2015    . levocetirizine (XYZAL) 5 MG tablet Take one tablet by mouth every evening if needed. 90 tablet 0  . metFORMIN (GLUCOPHAGE) 1000 MG tablet TAKE 1 TABLET TWICE A DAY WITH A MEAL 180 tablet 6  . misoprostol (CYTOTEC) 200 MCG tablet Place 1 tablet PV night before the procedure. Place 1 tablet PV the morning of the procedure. 2 tablet 0  . Mometasone Furoate (ASMANEX HFA) 200 MCG/ACT AERO Inhale 2 puffs into the lungs 2 (two) times daily. Rinse,  gargle, and spit after use. 1 Inhaler 5  . Multiple Vitamin (MULTI-VITAMINS) TABS 1 tablet.    Marland Kitchen NOVOFINE 32G X 6 MM MISC USE 1 NEEDLE DAILY 100 each 6  . Olopatadine HCl (PATADAY) 0.2 % SOLN Use one drop in each eye once daily as needed. 7.5 mL 0  . pantoprazole (PROTONIX) 40 MG tablet Take 1 tablet (40 mg total) by mouth 2 (two) times daily. 180 tablet 3  . SYNTHROID 125 MCG tablet TAKE 1 TABLET DAILY 90 tablet 3  . tizanidine (ZANAFLEX) 6 MG capsule Take 6 mg by mouth 3 (three) times daily.    . traMADol (ULTRAM) 50 MG tablet Reported on 01/15/2015  0  . Vitamin D, Ergocalciferol, (DRISDOL) 50000 units CAPS capsule take 1 capsule by mouth ONCE A WEEK 12 capsule 0  . EPINEPHrine (EPIPEN 2-PAK) 0.3 mg/0.3 mL IJ SOAJ injection Use as directed for life-threatening allergic reaction. (Patient not taking: Reported on 03/31/2016) 2 Device 3  . [DISCONTINUED] bethanechol (URECHOLINE) 5 MG tablet Take 1 tablet (5 mg total) by mouth 3 (three) times daily. 270 tablet 3   No current facility-administered medications on file prior to visit.    Corn-containing products; Peanut-containing drug products; Amoxicillin; and Other  Review of Systems  Genitourinary: Negative.    Vitals:   05/08/16 1515  BP: 128/80  Pulse: 96  Weight: 236 lb (107 kg)  Height: 5' 6.5" (1.689 m)    Gen:  WNWF healthy female NAD Abdomen: soft, non-tender Groin:  no inguinal nodes palpated  Pelvic exam: Vulva:  normal female genitalia Vagina:  normal vagina Cervix:  Non-tender, Negative CMT, no lesions or redness. Uterus:  normal shape, position and consistency   Procedure:  Speculum reinserted.  Cervix visualized and cleansed with Betadine x 3.  Paracervical block was placed.  Single toothed tenaculum applied to anterior lip of cervix without difficulty.  1% Lidocaine without epinephrine was used.  8cc total instilled at 3 and 9 o'clock positions on cervix.    IUD string noted and grasped with ringed forcep.  With one  pull, IUD removed easily.  Pt has some mild cramping but tolerated this well.  Then uterus sounded to 7cm.  Lot number: [id number].  Expiration:  6/19.  IUD package was opened.  IUD and introducer passed to fundus and then withdrawn slightly before IUD was passed into endometrial cavity.  Introducer removed.  Strings cut to 2cm.  Tenaculum removed from cervix.  Minimal bleeding noted.  Pt tolerated the procedure well.  All instruments removed from vagina.  A: Removal of Skyla IUD and reinsertion of Kyleena IUD H/o menorrhagia and iron deficiency anemia that resolve with Skyla IUD  P:  Return for recheck 6-8 weeks.  She may need to come later than  this due to a summer job but will check scheduled Pt aware to call for any concerns Pt aware removal due no later than 05/08/2021.  IUD card given to pt.

## 2016-05-31 ENCOUNTER — Other Ambulatory Visit: Payer: Self-pay | Admitting: Allergy and Immunology

## 2016-05-31 DIAGNOSIS — L309 Dermatitis, unspecified: Secondary | ICD-10-CM

## 2016-06-02 ENCOUNTER — Other Ambulatory Visit: Payer: Self-pay

## 2016-06-02 DIAGNOSIS — L309 Dermatitis, unspecified: Secondary | ICD-10-CM

## 2016-06-10 ENCOUNTER — Ambulatory Visit (INDEPENDENT_AMBULATORY_CARE_PROVIDER_SITE_OTHER): Payer: 59 | Admitting: Allergy and Immunology

## 2016-06-10 ENCOUNTER — Encounter: Payer: Self-pay | Admitting: Allergy and Immunology

## 2016-06-10 VITALS — BP 118/74 | HR 100 | Temp 97.8°F | Wt 238.9 lb

## 2016-06-10 DIAGNOSIS — K219 Gastro-esophageal reflux disease without esophagitis: Secondary | ICD-10-CM

## 2016-06-10 DIAGNOSIS — J3089 Other allergic rhinitis: Secondary | ICD-10-CM

## 2016-06-10 DIAGNOSIS — L52 Erythema nodosum: Secondary | ICD-10-CM

## 2016-06-10 DIAGNOSIS — J4540 Moderate persistent asthma, uncomplicated: Secondary | ICD-10-CM

## 2016-06-10 DIAGNOSIS — L2089 Other atopic dermatitis: Secondary | ICD-10-CM | POA: Diagnosis not present

## 2016-06-10 MED ORDER — FLUTICASONE FUROATE-VILANTEROL 200-25 MCG/INH IN AEPB: 1.0000 | INHALATION_SPRAY | Freq: Every day | RESPIRATORY_TRACT | 0 refills | Status: DC

## 2016-06-10 NOTE — Progress Notes (Signed)
Follow-up Note  Referring Provider: Aggie Hacker, MD Primary Provider: Aggie Hacker, MD Date of Office Visit: 06/10/2016  Subjective:   Valerie Marquez (DOB: 08-06-1995) is a 21 y.o. female who [facility] in re-evaluation of the following:  HPI: Valerie Marquez presents to this clinic in reevaluation of her asthma, allergic rhinoconjunctivitis, LPR, history of atopic dermatitis, and history of erythema nodosum. Her last visit to this clinic was approximately 1 year ago.  Overall she has really done very well with her asthma as long asa she continues to use Breo. While utilizing this combination inhaler she rarely uses a short acting bronchodilator and can exercise without any difficulty.  Her nose and eyes are doing relatively well as long as she continues on Xyzal and she continues on Optivar eyedrops.  She has not required a systemic steroid or antibiotics to treat any type of respiratory tract issue.  However, her erythema nodosum did flare in the fall and has flared the spring. She was initially treated with 40 mg of prednisone about one month ago for a flare affecting her lower extremities and she is now down to 30 mg a day. She has failed Humira and she's presently on Enbrel. Enbrel does not appear to be controlling this issue and there has been a discussion of starting rituximab.  Allergies as of 06/10/2016      Reactions   Corn-containing Products    Peanut-containing Drug Products    Amoxicillin Nausea And Vomiting, Rash   No reaction listed   Other Rash   Corn, soy, nuts (all types), cats, dogs      Medication List      acetaminophen 500 MG tablet Commonly known as:  TYLENOL Take 1,000 mg by mouth every 4 (four) hours as needed for fever (alternating with ibuprofen). Reported on 01/15/2015   albuterol 108 (90 Base) MCG/ACT inhaler Commonly known as:  PROAIR HFA Inhale two puffs every four to six hours as needed for cough or  wheeze.   albuterol (2.5 MG/3ML) 0.083% nebulizer solution Commonly known as:  PROVENTIL Take 2.5 mg by nebulization every 6 (six) hours as needed for wheezing. Reported on 01/15/2015   azelastine 0.05 % ophthalmic solution Commonly known as:  OPTIVAR Place 1 drop into both eyes 2 (two) times daily.   B-complex with vitamin C tablet Take 1 tablet by mouth 2 (two) times daily.   CALCIUM 500/D 500-200 MG-UNIT tablet Generic drug:  calcium-vitamin D 1 tablet.   cyproheptadine 4 MG tablet Commonly known as:  PERIACTIN Take 1 tablet by mouth at bedtime.   desonide 0.05 % cream Commonly known as:  DESOWEN Apply to affected areas once daily as needed.   ENBREL MINI 50 MG/ML Soct Generic drug:  Etanercept as directed.   EPINEPHrine 0.3 mg/0.3 mL Soaj injection Commonly known as:  EPIPEN 2-PAK Use as directed for life-threatening allergic reaction.   ezetimibe 10 MG tablet Commonly known as:  ZETIA TAKE 1 TABLET DAILY   fluticasone furoate-vilanterol 200-25 MCG/INH Aepb Commonly known as:  BREO ELLIPTA Inhale 1 puff into the lungs daily. Rinse, gargle, and spit after use.   ibuprofen 200 MG tablet Commonly known as:  ADVIL,MOTRIN Take 600 mg by mouth every 6 (six) hours as needed for pain or fever (alternating with tlyenol). Reported on 01/15/2015   levocetirizine 5 MG tablet Commonly known as:  XYZAL Take one tablet by mouth every evening if needed.   metFORMIN 1000 MG  tablet Commonly known as:  GLUCOPHAGE TAKE 1 TABLET TWICE A DAY WITH A MEAL   methylphenidate 40 MG 24 hr capsule Commonly known as:  RITALIN LA Take 40 mg by mouth every morning.   MULTI-VITAMINS Tabs 1 tablet.   NOVOFINE 32G X 6 MM Misc Generic drug:  Insulin Pen Needle USE 1 NEEDLE DAILY   Olopatadine HCl 0.2 % Soln Commonly known as:  PATADAY Use one drop in each eye once daily as needed.   pantoprazole 40 MG tablet Commonly known as:  PROTONIX Take 1 tablet (40 mg total) by mouth 2  (two) times daily.   SYNTHROID 125 MCG tablet Generic drug:  levothyroxine TAKE 1 TABLET DAILY   tizanidine 6 MG capsule Commonly known as:  ZANAFLEX Take 6 mg by mouth 3 (three) times daily.   traMADol 50 MG tablet Commonly known as:  ULTRAM Reported on 01/15/2015   Vitamin D (Ergocalciferol) 50000 units Caps capsule Commonly known as:  DRISDOL take 1 capsule by mouth ONCE A WEEK       Past Medical History:  Diagnosis Date  . Abnormal liver function tests   . Abnormal thyroid function test   . Acanthosis   . Allergic rhinitis   . Asthma   . Combined hyperlipidemia   . Concussion   . Diabetes mellitus (HCC)   . Diabetes mellitus without complication (HCC)   . Dyspepsia   . Eczema   . Erythema nodosum   . Gastroesophageal reflux   . Goiter    hypothyroidsm--hashimoto's  . Hypertension   . Iron deficiency anemia   . Lactose intolerance   . Obesity   . Prediabetes   . Psoriatic arthritis [facility])     Past Surgical History:  Procedure Laterality Date  . ANKLE SURGERY Right    --bone spur  . KNEE SURGERY Left 2014   repair of ACL  . sinuses    . TONSILLECTOMY AND ADENOIDECTOMY    . TYMPANOSTOMY TUBE PLACEMENT    . WISDOM TOOTH EXTRACTION      Review of systems negative except as noted in HPI / PMHx or noted below:  Review of Systems  Constitutional: Negative.   HENT: Negative.   Eyes: Negative.   Respiratory: Negative.   Cardiovascular: Negative.   Gastrointestinal: Negative.   Genitourinary: Negative.   Musculoskeletal: Negative.   Skin: Negative.   Neurological: Negative.   Endo/Heme/Allergies: Negative.   Psychiatric/Behavioral: Negative.      Objective:   Vitals:   06/10/16 0943  BP: 118/74  Pulse: 100  Temp: 97.8 F (36.6 C)      Weight: 238 lb 14.4 oz (108.4 kg)   Physical Exam  Constitutional: She is well-developed, well-nourished, and in no distress.  HENT:  Head: Normocephalic.  Right Ear: Tympanic membrane, external ear and  ear canal normal.  Left Ear: Tympanic membrane, external ear and ear canal normal.  Nose: Nose normal. No mucosal edema or rhinorrhea.  Mouth/Throat: Uvula is midline, oropharynx is clear and moist and mucous membranes are normal. No oropharyngeal exudate.  Eyes: Conjunctivae are normal.  Neck: Trachea normal. No tracheal tenderness present. No tracheal deviation present. No thyromegaly present.  Cardiovascular: Normal rate, regular rhythm, S1 normal, S2 normal and normal heart sounds.   No murmur heard. Pulmonary/Chest: Breath sounds normal. No stridor. No respiratory distress. She has no wheezes. She has no rales.  Musculoskeletal: She exhibits no edema.  Lymphadenopathy:       Head (right side): No tonsillar adenopathy present.  Head (left side): No tonsillar adenopathy present.    She has no cervical adenopathy.  Neurological: She is alert. Gait normal.  Skin: Rash (multiple indurated greater than 5 cm somewhat circumferential and serpiginous erythematous nodules lower extremity) noted. She is not diaphoretic. No erythema. Nails show no clubbing.  Psychiatric: Mood and affect normal.    Diagnostics:    Spirometry was performed and demonstrated an FEV1 of 3.49 at 102 % of predicted.  Assessment and Plan:   1. Asthma, moderate persistent, well-controlled   2. Other allergic rhinitis   3. Other atopic dermatitis   4. LPRD (laryngopharyngeal reflux disease)   5. Erythema nodosum     1. Continue Breo 200 one inhalation 1 time per day.    2. Continue ProAir HFA 2 puffs or albuterol nebulization every 4-6 hours if needed  3. Continue Protonix 40 two times per day  4. Continue antihistamine / levocetirizine 5 mg and Optivar if needed  5. Return to clinic in 12 months or earlier if problem  Valerie Marquez appears to be doing relatively well regarding her atopic disease but certainly her E. nodosum is out of control. Rituximab sounds like a very good option for treating this condition.  I suspect that this agent will probably also result in a dramatic diminution of her atopic disease. If she does well I will see her back in this clinic in 1 year. She will contact me during the interval should there be a significant problem.  Valerie SchimkeEric Ebon Ketchum, MD Allergy / Immunology [facility]

## 2016-06-10 NOTE — Patient Instructions (Signed)
  1. Continue Breo 200 one inhalation 1 time per day.    2. Continue ProAir HFA 2 puffs or albuterol nebulization every 4-6 hours if needed  3. Continue Protonix 40 two times per day  4. Continue antihistamine / levocetirizine 5 mg and Optivar if needed  5. Return to clinic in 12 months or earlier if problem

## 2016-06-17 ENCOUNTER — Other Ambulatory Visit: Payer: Self-pay | Admitting: Allergy and Immunology

## 2016-06-17 DIAGNOSIS — L309 Dermatitis, unspecified: Secondary | ICD-10-CM

## 2016-07-11 ENCOUNTER — Emergency Department (HOSPITAL_COMMUNITY): Payer: 59

## 2016-07-11 ENCOUNTER — Emergency Department (HOSPITAL_COMMUNITY)
Admission: EM | Admit: 2016-07-11 | Discharge: 2016-07-11 | Disposition: A | Discharge status: Home / Self Care | Payer: 59 | Attending: Emergency Medicine | Admitting: Emergency Medicine

## 2016-07-11 ENCOUNTER — Encounter (HOSPITAL_COMMUNITY): Payer: Self-pay | Admitting: Emergency Medicine

## 2016-07-11 DIAGNOSIS — R1013 Epigastric pain: Secondary | ICD-10-CM | POA: Diagnosis present

## 2016-07-11 DIAGNOSIS — I10 Essential (primary) hypertension: Secondary | ICD-10-CM | POA: Insufficient documentation

## 2016-07-11 DIAGNOSIS — Z7984 Long term (current) use of oral hypoglycemic drugs: Secondary | ICD-10-CM | POA: Diagnosis not present

## 2016-07-11 DIAGNOSIS — Z91010 Allergy to peanuts: Secondary | ICD-10-CM | POA: Insufficient documentation

## 2016-07-11 DIAGNOSIS — R112 Nausea with vomiting, unspecified: Secondary | ICD-10-CM | POA: Insufficient documentation

## 2016-07-11 DIAGNOSIS — E039 Hypothyroidism, unspecified: Secondary | ICD-10-CM | POA: Insufficient documentation

## 2016-07-11 DIAGNOSIS — R197 Diarrhea, unspecified: Secondary | ICD-10-CM | POA: Insufficient documentation

## 2016-07-11 DIAGNOSIS — E119 Type 2 diabetes mellitus without complications: Secondary | ICD-10-CM | POA: Diagnosis not present

## 2016-07-11 DIAGNOSIS — Z79899 Other long term (current) drug therapy: Secondary | ICD-10-CM | POA: Diagnosis not present

## 2016-07-11 DIAGNOSIS — J45909 Unspecified asthma, uncomplicated: Secondary | ICD-10-CM | POA: Diagnosis not present

## 2016-07-11 DIAGNOSIS — R109 Unspecified abdominal pain: Secondary | ICD-10-CM

## 2016-07-11 LAB — URINALYSIS, ROUTINE W REFLEX MICROSCOPIC
BILIRUBIN URINE: NEGATIVE
Glucose, UA: NEGATIVE mg/dL
Hgb urine dipstick: NEGATIVE
KETONES UR: NEGATIVE mg/dL
NITRITE: NEGATIVE
Protein, ur: NEGATIVE mg/dL
Specific Gravity, Urine: 1.019 (ref 1.005–1.030)
pH: 6 (ref 5.0–8.0)

## 2016-07-11 LAB — CBC
HEMATOCRIT: 34.3 % — AB (ref 36.0–46.0)
HEMOGLOBIN: 10.5 g/dL — AB (ref 12.0–15.0)
MCH: 23.9 pg — ABNORMAL LOW (ref 26.0–34.0)
MCHC: 30.6 g/dL (ref 30.0–36.0)
MCV: 78 fL (ref 78.0–100.0)
Platelets: 325 10*3/uL (ref 150–400)
RBC: 4.4 MIL/uL (ref 3.87–5.11)
RDW: 14.2 % (ref 11.5–15.5)
WBC: 12.1 10*3/uL — AB (ref 4.0–10.5)

## 2016-07-11 LAB — COMPREHENSIVE METABOLIC PANEL
ALBUMIN: 3.4 g/dL — AB (ref 3.5–5.0)
ALT: 25 U/L (ref 14–54)
ANION GAP: 8 (ref 5–15)
AST: 17 U/L (ref 15–41)
Alkaline Phosphatase: 36 U/L — ABNORMAL LOW (ref 38–126)
BILIRUBIN TOTAL: 0.4 mg/dL (ref 0.3–1.2)
BUN: 10 mg/dL (ref 6–20)
CO2: 24 mmol/L (ref 22–32)
Calcium: 8.7 mg/dL — ABNORMAL LOW (ref 8.9–10.3)
Chloride: 104 mmol/L (ref 101–111)
Creatinine, Ser: 0.73 mg/dL (ref 0.44–1.00)
GFR calc Af Amer: >60 mL/min (ref >60)
Glucose, Bld: 108 mg/dL — ABNORMAL HIGH (ref 65–99)
POTASSIUM: 4.2 mmol/L (ref 3.5–5.1)
Sodium: 136 mmol/L (ref 135–145)
TOTAL PROTEIN: 6.8 g/dL (ref 6.5–8.1)

## 2016-07-11 LAB — I-STAT CHEM 8, ED
BUN: 10 mg/dL (ref 6–20)
CALCIUM ION: 1.18 mmol/L (ref 1.15–1.40)
CHLORIDE: 104 mmol/L (ref 101–111)
Creatinine, Ser: 0.6 mg/dL (ref 0.44–1.00)
GLUCOSE: 104 mg/dL — AB (ref 65–99)
HCT: 34 % — ABNORMAL LOW (ref 36.0–46.0)
Hemoglobin: 11.6 g/dL — ABNORMAL LOW (ref 12.0–15.0)
Potassium: 4.2 mmol/L (ref 3.5–5.1)
Sodium: 139 mmol/L (ref 135–145)
TCO2: 25 mmol/L (ref 0–100)

## 2016-07-11 LAB — SEDIMENTATION RATE: SED RATE: 47 mm/h — AB (ref 0–22)

## 2016-07-11 LAB — I-STAT BETA HCG BLOOD, ED (MC, WL, AP ONLY)

## 2016-07-11 LAB — LIPASE, BLOOD: LIPASE: 24 U/L (ref 11–51)

## 2016-07-11 LAB — C-REACTIVE PROTEIN: CRP: 4.4 mg/dL — ABNORMAL HIGH (ref <1.0)

## 2016-07-11 MED ORDER — SODIUM CHLORIDE 0.9 % IV BOLUS (SEPSIS)
1000.0000 mL | Freq: Once | INTRAVENOUS | Status: AC
  Administered 2016-07-11: 1000 mL via INTRAVENOUS

## 2016-07-11 MED ORDER — KETOROLAC TROMETHAMINE 15 MG/ML IJ SOLN
15.0000 mg | Freq: Once | INTRAMUSCULAR | Status: AC
  Administered 2016-07-11: 15 mg via INTRAVENOUS
  Filled 2016-07-11: qty 1

## 2016-07-11 MED ORDER — ONDANSETRON HCL 4 MG/2ML IJ SOLN
4.0000 mg | Freq: Once | INTRAMUSCULAR | Status: AC
  Administered 2016-07-11: 4 mg via INTRAVENOUS
  Filled 2016-07-11: qty 2

## 2016-07-11 MED ORDER — IOPAMIDOL (ISOVUE-300) INJECTION 61%
INTRAVENOUS | Status: AC
  Administered 2016-07-11: 100 mL
  Filled 2016-07-11: qty 100

## 2016-07-11 MED ORDER — PROMETHAZINE HCL 25 MG PO TABS: 25.0000 mg | ORAL_TABLET | Freq: Four times a day (QID) | ORAL | 0 refills | Status: DC | PRN

## 2016-07-11 NOTE — ED Triage Notes (Signed)
Pt states since Monday shes had RLQ abdominal pain and tenderness, saw PCP and was given keflex on Wednesday without relief, pt febrile and tachy, HR 110. Pt sent here to R/u appy.

## 2016-07-11 NOTE — ED Provider Notes (Signed)
[facility]-EMERGENCY DEPT Provider Note   CSN: [id number] Arrival date & time: 07/11/16  1338     History   Chief Complaint Chief Complaint  Patient presents with  . Abdominal Pain    HPI Valerie Marquez is a 21 y.o. female.  HPI  Patient is a 21 year old female past medical history significant for hypothyroidism, diabetes, psoriatic arthritis, erythema nodosum, who presents to the emergency department with a five-day history of abdominal pain. 5 days ago had some mild epigastric/. Umbilical abdominal discomfort, dull, nonradiating. Associated with fever and nausea. Patient was evaluated by her primary care physician and started on Keflex. Over the past 2 days now having worsening right-sided abdominal pain, dull, nonradiating. Continues to have nausea and fever. 2 episodes of nonbloody diarrhea. Denies dysuria, vaginal bleeding, vaginal discharge. Not sexually active, IUD in place. Colonoscopy and endoscopy within the past month that was negative for IBD.  Past Medical History:  Diagnosis Date  . Abnormal liver function tests   . Abnormal thyroid function test   . Acanthosis   . Allergic rhinitis   . Asthma   . Combined hyperlipidemia   . Concussion   . Diabetes mellitus (HCC)   . Diabetes mellitus without complication (HCC)   . Dyspepsia   . Eczema   . Erythema nodosum   . Gastroesophageal reflux   . Goiter    hypothyroidsm--hashimoto's  . Hypertension   . Iron deficiency anemia   . Lactose intolerance   . Obesity   . Prediabetes   . Psoriatic arthritis [facility])     Patient Active Problem List   Diagnosis Date Noted  . Precordial chest pain 11/13/2014  . Erythema nodosum   . Microscopic hematuria   . Vitamin D deficiency 05/24/2014  . Eczema of both hands 05/24/2014  . Menorrhagia 11/10/2012  . RUQ abdominal pain 11/09/2012  . Abnormal findings on diagnostic imaging of gall bladder 11/09/2012  . Type 2 diabetes mellitus (HCC) 04/07/2012  . Hypothyroid 01/04/2012    . Iron deficiency anemia 01/04/2012  . Acanthosis   . Goiter   . Abnormal thyroid function test   . Combined hyperlipidemia   . Dyspepsia   . Hypertension   . Abnormal transaminases   . Asthma   . Gastroesophageal reflux   . Obesity 06/30/2010  . Mixed hyperlipidemia 06/30/2010    Past Surgical History:  Procedure Laterality Date  . ANKLE SURGERY Right    --bone spur  . KNEE SURGERY Left 2014   repair of ACL  . sinuses    . TONSILLECTOMY AND ADENOIDECTOMY    . TYMPANOSTOMY TUBE PLACEMENT    . WISDOM TOOTH EXTRACTION      OB History    Gravida Para Term Preterm AB Living   0 0 0 0 0 0   SAB TAB Ectopic Multiple Live Births   0 0 0 0 0       Home Medications    Prior to Admission medications   Medication Sig Start Date End Date Taking? Authorizing Provider  amphetamine-dextroamphetamine (ADDERALL XR) 20 MG 24 hr capsule Take 20 mg by mouth daily. 06/18/16  Yes [provider]  cyproheptadine (PERIACTIN) 4 MG tablet Take 4 mg by mouth at bedtime.  04/30/16  Yes [provider]  Etanercept (ENBREL MINI) 50 MG/ML SOCT Inject 50 mg into the skin every Monday.   Yes [provider]  ezetimibe (ZETIA) 10 MG tablet TAKE 1 TABLET DAILY 04/10/16  Yes Dessa Phi, MD  metFORMIN (GLUCOPHAGE)  1000 MG tablet TAKE 1 TABLET TWICE A DAY WITH A MEAL 02/03/16  Yes David Stall, MD  pantoprazole (PROTONIX) 40 MG tablet Take 1 tablet (40 mg total) by mouth 2 (two) times daily. 01/17/16  Yes Gretchen Short, NP  SYNTHROID 125 MCG tablet TAKE 1 TABLET DAILY 02/10/16  Yes Dessa Phi, MD  acetaminophen (TYLENOL) 500 MG tablet Take 1,000 mg by mouth every 4 (four) hours as needed for fever (alternating with ibuprofen). Reported on 01/15/2015    [provider]  albuterol (PROAIR HFA) 108 (90 Base) MCG/ACT inhaler Inhale two puffs every four to six hours as needed for cough or wheeze. 01/14/16   Kozlow, Alvira Philips, MD  albuterol (PROVENTIL) (2.5 MG/3ML)  0.083% nebulizer solution Take 2.5 mg by nebulization every 6 (six) hours as needed for wheezing. Reported on 01/15/2015    [provider]  azelastine (OPTIVAR) 0.05 % ophthalmic solution Place 1 drop into both eyes 2 (two) times daily. 04/15/15   Kozlow, Alvira Philips, MD  B Complex-C (B-COMPLEX WITH VITAMIN C) tablet Take 1 tablet by mouth 2 (two) times daily.    [provider]  calcium-vitamin D (CALCIUM 500/D) 500-200 MG-UNIT per tablet 1 tablet.    [provider]  desonide (DESOWEN) 0.05 % cream apply to affected area once daily if needed 06/17/16   Kozlow, Alvira Philips, MD  ENBREL MINI 50 MG/ML SOCT as directed. 05/04/16   [provider]  EPINEPHrine (EPIPEN 2-PAK) 0.3 mg/0.3 mL IJ SOAJ injection Use as directed for life-threatening allergic reaction. Patient not taking: Reported on 03/31/2016 01/16/15   Jessica Priest, MD  fluticasone furoate-vilanterol (BREO ELLIPTA) 200-25 MCG/INH AEPB Inhale 1 puff into the lungs daily. Rinse, gargle, and spit after use. 06/10/16   Kozlow, Alvira Philips, MD  ibuprofen (ADVIL,MOTRIN) 200 MG tablet Take 600 mg by mouth every 6 (six) hours as needed for pain or fever (alternating with tlyenol). Reported on 01/15/2015    [provider]  levocetirizine (XYZAL) 5 MG tablet Take one tablet by mouth every evening if needed. Patient taking differently: Take 5 mg by mouth daily.  01/14/16   Kozlow, Alvira Philips, MD  methylphenidate (RITALIN LA) 40 MG 24 hr capsule Take 40 mg by mouth every morning.    [provider]  Multiple Vitamin (MULTI-VITAMINS) TABS 1 tablet.    [provider]  NOVOFINE 32G X 6 MM MISC USE 1 NEEDLE DAILY 03/20/16   Dessa Phi, MD  Olopatadine HCl (PATADAY) 0.2 % SOLN Use one drop in each eye once daily as needed. 01/14/16   Kozlow, Alvira Philips, MD  promethazine (PHENERGAN) 25 MG tablet Take 1 tablet (25 mg total) by mouth every 6 (six) hours as needed for nausea or vomiting. 07/11/16 07/14/16  Corena Herter,  MD  tizanidine (ZANAFLEX) 6 MG capsule Take 6 mg by mouth 3 (three) times daily.    [provider]  traMADol Janean Sark) 50 MG tablet Reported on 01/15/2015 12/20/13   [provider]  Vitamin D, Ergocalciferol, (DRISDOL) 50000 units CAPS capsule take 1 capsule by mouth ONCE A WEEK 01/21/16   Dessa Phi, MD    Family History Family History  Problem Relation Age of Onset  . Diabetes Mother   . Lupus Mother   . Hyperlipidemia Father   . Hypertension Father   . Diabetes Father   . Colon cancer Father   . Thyroid disease Brother   . Eczema Brother   . Hyperlipidemia Brother   .  Diabetes Brother        pre diabetic  . Cholelithiasis Maternal Grandmother   . Diabetes Maternal Grandmother   . Cervical cancer Maternal Grandmother   . Stroke Maternal Grandmother   . Cancer Maternal Grandfather   . Diabetes Maternal Grandfather   . Stroke Maternal Grandfather   . Diabetes Paternal Grandmother   . Diabetes Paternal Grandfather     Social History Social History  Substance Use Topics  . Smoking status: Never Smoker  . Smokeless tobacco: Never Used  . Alcohol use No     Allergies   Corn-containing products; Peanut-containing drug products; Amoxicillin; and Other   Review of Systems Review of Systems  Constitutional: Positive for appetite change, chills and fever.  HENT: Negative for congestion and trouble swallowing.   Eyes: Negative for visual disturbance.  Respiratory: Negative for cough, chest tightness and shortness of breath.   Cardiovascular: Negative for chest pain.  Gastrointestinal: Positive for abdominal pain, diarrhea and nausea. Negative for blood in stool and vomiting.  Genitourinary: Negative for decreased urine volume, dysuria, flank pain, hematuria, urgency, vaginal bleeding and vaginal discharge.  Musculoskeletal: Negative for back pain.  Skin: Negative for rash and wound.  Allergic/Immunologic: Positive for immunocompromised state.    Neurological: Negative for dizziness, seizures, weakness and light-headedness.  Psychiatric/Behavioral: Negative for behavioral problems.     Physical Exam Updated Vital Signs BP 125/80   Pulse 99   Temp 98.2 F (36.8 C)   Resp 16   Ht 5\' 7"  (1.702 m)   Wt 108.9 kg (240 lb)   LMP 07/07/2016   SpO2 99%   BMI 37.59 kg/m   Physical Exam  Constitutional: She is oriented to person, place, and time. She appears well-developed and well-nourished. No distress.  HENT:  Head: Atraumatic.  Mouth/Throat: Oropharynx is clear and moist.  Eyes: Conjunctivae and EOM are normal. Pupils are equal, round, and reactive to light.  Neck: Normal range of motion. Neck supple.  Cardiovascular: Normal rate, regular rhythm, normal heart sounds and intact distal pulses.   No murmur heard. Pulmonary/Chest: Effort normal and breath sounds normal. No respiratory distress.  Abdominal: Soft. She exhibits no distension and no mass. There is tenderness ( Moderate tenderness to palpation to the right lower quadrant). There is no rebound and no guarding.  + McBurney's point. Negative Murphy's sign. No CVA tenderness. Negative psoas and Rovsing sign.  Musculoskeletal: Normal range of motion. She exhibits no edema.  Neurological: She is alert and oriented to person, place, and time.  Skin: Skin is warm. Capillary refill takes less than 2 seconds. No pallor.  Psychiatric: She has a normal mood and affect.     ED Treatments / Results  Labs (all labs ordered are listed, but only abnormal results are displayed) Labs Reviewed  COMPREHENSIVE METABOLIC PANEL - Abnormal; Notable for the following:       Result Value   Glucose, Bld 108 (*)    Calcium 8.7 (*)    Albumin 3.4 (*)    Alkaline Phosphatase 36 (*)    All other components within normal limits  CBC - Abnormal; Notable for the following:    WBC 12.1 (*)    Hemoglobin 10.5 (*)    HCT 34.3 (*)    MCH 23.9 (*)    All other components within normal limits   URINALYSIS, ROUTINE W REFLEX MICROSCOPIC - Abnormal; Notable for the following:    APPearance CLOUDY (*)    Leukocytes, UA MODERATE (*)  Bacteria, UA RARE (*)    Squamous Epithelial / LPF 6-30 (*)    All other components within normal limits  SEDIMENTATION RATE - Abnormal; Notable for the following:    Sed Rate 47 (*)    All other components within normal limits  C-REACTIVE PROTEIN - Abnormal; Notable for the following:    CRP 4.4 (*)    All other components within normal limits  I-STAT CHEM 8, ED - Abnormal; Notable for the following:    Glucose, Bld 104 (*)    Hemoglobin 11.6 (*)    HCT 34.0 (*)    All other components within normal limits  LIPASE, BLOOD  I-STAT BETA HCG BLOOD, ED ([facility], [facility], [facility] ONLY)    EKG  EKG Interpretation None       Radiology Ct Abdomen Pelvis W Contrast  Result Date: 07/11/2016 CLINICAL DATA:  Right lower quadrant abdominal pain for 4 days with nausea. EXAM: CT ABDOMEN AND PELVIS WITH CONTRAST TECHNIQUE: Multidetector CT imaging of the abdomen and pelvis was performed using the standard protocol following bolus administration of intravenous contrast. CONTRAST:  ISOVUE-300 IOPAMIDOL (ISOVUE-300) INJECTION 61% COMPARISON:  07/06/2006 CT abdomen/pelvis. FINDINGS: Lower chest: No significant pulmonary nodules or acute consolidative airspace disease. Hepatobiliary: Normal liver with no liver mass. Normal gallbladder with no radiopaque cholelithiasis. No biliary ductal dilatation. Pancreas: Normal, with no mass or duct dilation. Spleen: Normal size. No mass. Adrenals/Urinary Tract: Normal adrenals. Normal size kidneys with symmetric contrast nephrograms. Subcentimeter hypodense renal cortical lesion in the interpolar right kidney, too small to characterize, for which no further follow-up is required. No additional renal lesions. No hydronephrosis. Normal bladder. Stomach/Bowel: Grossly normal stomach. Normal caliber small bowel with no small bowel wall  thickening. Normal appendix. Normal large bowel with no diverticulosis, large bowel wall thickening or pericolonic fat stranding. Vascular/Lymphatic: Normal caliber abdominal aorta. Patent portal, splenic, hepatic and renal veins. No pathologically enlarged lymph nodes in the abdomen or pelvis. Reproductive: Grossly normal anteverted uterus with IUD grossly normal in position within the uterine cavity. Simple appearing 1.9 cm right adnexal cyst. No left adnexal lesions. Other: No pneumoperitoneum, ascites or focal fluid collection. Musculoskeletal: No aggressive appearing focal osseous lesions. IMPRESSION: 1. No evidence of bowel obstruction or acute bowel inflammation. Normal appendix . 2. Right adnexal 1.9 cm cyst, for which no further evaluation is required (unless as otherwise clinically warranted). This recommendation follows ACR consensus guidelines: White Paper of the ACR Incidental Findings Committee II on Adnexal Findings. J Am Coll Radiol [phone]. Electronically Signed   By: Delbert Phenix M.D.   On: 07/11/2016 17:17    Procedures Procedures (including critical care time)  Medications Ordered in ED Medications  iopamidol (ISOVUE-300) 61 % injection (100 mLs  Contrast Given 07/11/16 1624)  ondansetron (ZOFRAN) injection 4 mg (4 mg Intravenous Given 07/11/16 1544)  ketorolac (TORADOL) 15 MG/ML injection 15 mg (15 mg Intravenous Given 07/11/16 1544)  sodium chloride 0.9 % bolus 1,000 mL (1,000 mLs Intravenous New Bag/Given 07/11/16 1546)     Initial Impression / Assessment and Plan / ED Course  I have reviewed the triage vital signs and the nursing notes.  Pertinent labs & imaging results that were available during my care of the patient were reviewed by me and considered in my medical decision making (see chart for details).     Patient is a 21 year old female with past medical history significant for diabetes, multiple autoimmune disorders, currently on prednisone, who presents to  the emergency department for  a 5 day history of abdominal pain associated with nausea and fever. Treated with Keflex for 3 days without improvement of symptoms. Now with right lower quadrant abdominal pain. No acute distress, not ill appearing. Afebrile, tachycardic to the 110s, normotensive. Abdominal exam with right-sided tenderness to palpation without signs of peritonitis.  Differential diagnosis includes appendicitis, IBD, viral gastroenteritis. Less likely PID, not sexually active, no h/o STI. Lab work remarkable for leukocytosis. No significant Electrolyte abnormalities. Will obtain a CT A/P for further evaluation. Given IV fluid, antiemetics, pain medication.  CT imaging showed no acute findings. Normal appendix. No signs of IBD. Adnexal cyst measuring 1.9 cm no concern for TOA or ovarian torsion. Patient most likely with a viral gastroenteritis. Given a prescription for Zofran. Tachycardia resolved.  Patient stable for discharge home. Given strict return precautions to the emergency department for worsening of symptoms. Discussed follow-up with her primary care physician. Patient expressed understanding, no questions or concerns at time of discharge.  Final Clinical Impressions(s) / ED Diagnoses   Final diagnoses:  Nausea vomiting and diarrhea  Abdominal pain, unspecified abdominal location    New Prescriptions New Prescriptions   PROMETHAZINE (PHENERGAN) 25 MG TABLET    Take 1 tablet (25 mg total) by mouth every 6 (six) hours as needed for nausea or vomiting.     Corena HerterMumma, Chrissie Dacquisto, MD 07/11/16 1750    Cathren LaineSteinl, Kevin, MD 07/11/16 [phone]

## 2016-07-18 ENCOUNTER — Encounter (HOSPITAL_COMMUNITY): Payer: Self-pay | Admitting: Emergency Medicine

## 2016-07-18 ENCOUNTER — Emergency Department (HOSPITAL_COMMUNITY): Payer: 59

## 2016-07-18 ENCOUNTER — Inpatient Hospital Stay (HOSPITAL_COMMUNITY)
Admission: EM | Admit: 2016-07-18 | Discharge: 2016-07-20 | DRG: 596 | Disposition: A | Discharge status: Home / Self Care | Payer: 59 | Attending: Internal Medicine | Admitting: Internal Medicine

## 2016-07-18 DIAGNOSIS — R195 Other fecal abnormalities: Secondary | ICD-10-CM

## 2016-07-18 DIAGNOSIS — R000 Tachycardia, unspecified: Secondary | ICD-10-CM | POA: Diagnosis present

## 2016-07-18 DIAGNOSIS — A419 Sepsis, unspecified organism: Secondary | ICD-10-CM | POA: Diagnosis not present

## 2016-07-18 DIAGNOSIS — Z888 Allergy status to other drugs, medicaments and biological substances status: Secondary | ICD-10-CM

## 2016-07-18 DIAGNOSIS — Z91010 Allergy to peanuts: Secondary | ICD-10-CM | POA: Diagnosis not present

## 2016-07-18 DIAGNOSIS — I160 Hypertensive urgency: Secondary | ICD-10-CM | POA: Diagnosis present

## 2016-07-18 DIAGNOSIS — E063 Autoimmune thyroiditis: Secondary | ICD-10-CM | POA: Diagnosis present

## 2016-07-18 DIAGNOSIS — Z880 Allergy status to penicillin: Secondary | ICD-10-CM | POA: Diagnosis not present

## 2016-07-18 DIAGNOSIS — L52 Erythema nodosum: Principal | ICD-10-CM | POA: Diagnosis present

## 2016-07-18 DIAGNOSIS — E038 Other specified hypothyroidism: Secondary | ICD-10-CM | POA: Diagnosis not present

## 2016-07-18 DIAGNOSIS — J4530 Mild persistent asthma, uncomplicated: Secondary | ICD-10-CM | POA: Diagnosis present

## 2016-07-18 DIAGNOSIS — R197 Diarrhea, unspecified: Secondary | ICD-10-CM | POA: Diagnosis present

## 2016-07-18 DIAGNOSIS — R509 Fever, unspecified: Secondary | ICD-10-CM

## 2016-07-18 DIAGNOSIS — I10 Essential (primary) hypertension: Secondary | ICD-10-CM | POA: Diagnosis present

## 2016-07-18 DIAGNOSIS — K219 Gastro-esophageal reflux disease without esophagitis: Secondary | ICD-10-CM | POA: Diagnosis present

## 2016-07-18 DIAGNOSIS — I158 Other secondary hypertension: Secondary | ICD-10-CM

## 2016-07-18 DIAGNOSIS — E782 Mixed hyperlipidemia: Secondary | ICD-10-CM | POA: Diagnosis present

## 2016-07-18 DIAGNOSIS — D509 Iron deficiency anemia, unspecified: Secondary | ICD-10-CM | POA: Diagnosis present

## 2016-07-18 DIAGNOSIS — B9789 Other viral agents as the cause of diseases classified elsewhere: Secondary | ICD-10-CM | POA: Diagnosis present

## 2016-07-18 DIAGNOSIS — E119 Type 2 diabetes mellitus without complications: Secondary | ICD-10-CM

## 2016-07-18 DIAGNOSIS — J00 Acute nasopharyngitis [common cold]: Secondary | ICD-10-CM | POA: Diagnosis present

## 2016-07-18 DIAGNOSIS — L309 Dermatitis, unspecified: Secondary | ICD-10-CM | POA: Diagnosis present

## 2016-07-18 LAB — COMPREHENSIVE METABOLIC PANEL
ALK PHOS: 32 U/L — AB (ref 38–126)
ALT: 37 U/L (ref 14–54)
AST: 25 U/L (ref 15–41)
Albumin: 3.2 g/dL — ABNORMAL LOW (ref 3.5–5.0)
Anion gap: 10 (ref 5–15)
BILIRUBIN TOTAL: 0.4 mg/dL (ref 0.3–1.2)
BUN: 10 mg/dL (ref 6–20)
CALCIUM: 8.7 mg/dL — AB (ref 8.9–10.3)
CHLORIDE: 104 mmol/L (ref 101–111)
CO2: 21 mmol/L — ABNORMAL LOW (ref 22–32)
CREATININE: 0.65 mg/dL (ref 0.44–1.00)
Glucose, Bld: 116 mg/dL — ABNORMAL HIGH (ref 65–99)
Potassium: 4.2 mmol/L (ref 3.5–5.1)
Sodium: 135 mmol/L (ref 135–145)
Total Protein: 6.7 g/dL (ref 6.5–8.1)

## 2016-07-18 LAB — CBC WITH DIFFERENTIAL/PLATELET
BASOS PCT: 0 %
Basophils Absolute: 0 10*3/uL (ref 0.0–0.1)
EOS PCT: 0 %
Eosinophils Absolute: 0 10*3/uL (ref 0.0–0.7)
HCT: 34.9 % — ABNORMAL LOW (ref 36.0–46.0)
Hemoglobin: 10.7 g/dL — ABNORMAL LOW (ref 12.0–15.0)
LYMPHS ABS: 1.2 10*3/uL (ref 0.7–4.0)
Lymphocytes Relative: 6 %
MCH: 23.2 pg — AB (ref 26.0–34.0)
MCHC: 30.7 g/dL (ref 30.0–36.0)
MCV: 75.7 fL — ABNORMAL LOW (ref 78.0–100.0)
Monocytes Absolute: 0.9 10*3/uL (ref 0.1–1.0)
Monocytes Relative: 5 %
NEUTROS PCT: 89 %
Neutro Abs: 17.1 10*3/uL — ABNORMAL HIGH (ref 1.7–7.7)
PLATELETS: 425 10*3/uL — AB (ref 150–400)
RBC: 4.61 MIL/uL (ref 3.87–5.11)
RDW: 14.4 % (ref 11.5–15.5)
WBC: 19.2 10*3/uL — AB (ref 4.0–10.5)

## 2016-07-18 LAB — URINALYSIS, ROUTINE W REFLEX MICROSCOPIC
Bacteria, UA: NONE SEEN
Bilirubin Urine: NEGATIVE
GLUCOSE, UA: NEGATIVE mg/dL
Hgb urine dipstick: NEGATIVE
Ketones, ur: NEGATIVE mg/dL
Nitrite: NEGATIVE
PH: 6 (ref 5.0–8.0)
Protein, ur: 30 mg/dL — AB
SPECIFIC GRAVITY, URINE: 1.027 (ref 1.005–1.030)

## 2016-07-18 LAB — C-REACTIVE PROTEIN: CRP: 6.4 mg/dL — ABNORMAL HIGH (ref <1.0)

## 2016-07-18 LAB — I-STAT CG4 LACTIC ACID, ED
LACTIC ACID, VENOUS: 1.99 mmol/L — AB (ref 0.5–1.9)
Lactic Acid, Venous: 1.39 mmol/L (ref 0.5–1.9)

## 2016-07-18 LAB — POC URINE PREG, ED: Preg Test, Ur: NEGATIVE

## 2016-07-18 MED ORDER — OXYCODONE HCL 5 MG PO TABS: 5.0000 mg | ORAL_TABLET | ORAL | Status: DC | PRN

## 2016-07-18 MED ORDER — CALCIUM CARBONATE-VITAMIN D 500-200 MG-UNIT PO TABS
1.0000 | ORAL_TABLET | Freq: Every day | ORAL | Status: DC
  Administered 2016-07-19 – 2016-07-20 (×2): 1 via ORAL
  Filled 2016-07-18 (×2): qty 1

## 2016-07-18 MED ORDER — FLUTICASONE FUROATE-VILANTEROL 200-25 MCG/INH IN AEPB
1.0000 | INHALATION_SPRAY | Freq: Every day | RESPIRATORY_TRACT | Status: DC
  Administered 2016-07-19 – 2016-07-20 (×2): 1 via RESPIRATORY_TRACT
  Filled 2016-07-18: qty 28

## 2016-07-18 MED ORDER — HYDRALAZINE HCL 20 MG/ML IJ SOLN: 10.0000 mg | INTRAMUSCULAR | Status: DC | PRN

## 2016-07-18 MED ORDER — SODIUM CHLORIDE 0.9 % IV SOLN
INTRAVENOUS | Status: AC
  Administered 2016-07-19: 01:00:00 via INTRAVENOUS

## 2016-07-18 MED ORDER — SODIUM CHLORIDE 0.9% FLUSH
3.0000 mL | Freq: Two times a day (BID) | INTRAVENOUS | Status: DC
  Administered 2016-07-19 (×2): 3 mL via INTRAVENOUS

## 2016-07-18 MED ORDER — AMPHETAMINE-DEXTROAMPHET ER 10 MG PO CP24: 20.0000 mg | ORAL_CAPSULE | Freq: Every day | ORAL | Status: DC

## 2016-07-18 MED ORDER — B COMPLEX-C PO TABS
1.0000 | ORAL_TABLET | Freq: Every day | ORAL | Status: DC
  Administered 2016-07-19 – 2016-07-20 (×2): 1 via ORAL
  Filled 2016-07-18 (×2): qty 1

## 2016-07-18 MED ORDER — EZETIMIBE 10 MG PO TABS
10.0000 mg | ORAL_TABLET | Freq: Every day | ORAL | Status: DC
  Administered 2016-07-19 – 2016-07-20 (×2): 10 mg via ORAL
  Filled 2016-07-18 (×2): qty 1

## 2016-07-18 MED ORDER — ADULT MULTIVITAMIN W/MINERALS CH
1.0000 | ORAL_TABLET | Freq: Every day | ORAL | Status: DC
  Administered 2016-07-19 – 2016-07-20 (×2): 1 via ORAL
  Filled 2016-07-18 (×2): qty 1

## 2016-07-18 MED ORDER — CYPROHEPTADINE HCL 4 MG PO TABS
4.0000 mg | ORAL_TABLET | Freq: Every day | ORAL | Status: DC
  Administered 2016-07-19 (×2): 4 mg via ORAL
  Filled 2016-07-18 (×2): qty 1

## 2016-07-18 MED ORDER — IBUPROFEN 800 MG PO TABS
800.0000 mg | ORAL_TABLET | Freq: Once | ORAL | Status: AC
  Administered 2016-07-18: 800 mg via ORAL
  Filled 2016-07-18: qty 1

## 2016-07-18 MED ORDER — ENOXAPARIN SODIUM 40 MG/0.4ML ~~LOC~~ SOLN
40.0000 mg | SUBCUTANEOUS | Status: DC
  Administered 2016-07-19: 40 mg via SUBCUTANEOUS
  Filled 2016-07-18 (×2): qty 0.4

## 2016-07-18 MED ORDER — PANTOPRAZOLE SODIUM 40 MG PO TBEC
40.0000 mg | DELAYED_RELEASE_TABLET | Freq: Two times a day (BID) | ORAL | Status: DC
  Administered 2016-07-19 – 2016-07-20 (×4): 40 mg via ORAL
  Filled 2016-07-18 (×4): qty 1

## 2016-07-18 MED ORDER — CETIRIZINE HCL 10 MG PO TABS
10.0000 mg | ORAL_TABLET | Freq: Every day | ORAL | Status: DC
  Administered 2016-07-19 – 2016-07-20 (×2): 10 mg via ORAL
  Filled 2016-07-18 (×2): qty 1

## 2016-07-18 MED ORDER — METHYLPREDNISOLONE SODIUM SUCC 125 MG IJ SOLR
125.0000 mg | Freq: Once | INTRAMUSCULAR | Status: AC
  Administered 2016-07-18: 125 mg via INTRAVENOUS
  Filled 2016-07-18: qty 2

## 2016-07-18 MED ORDER — OXYCODONE-ACETAMINOPHEN 5-325 MG PO TABS
1.0000 | ORAL_TABLET | Freq: Once | ORAL | Status: AC
  Administered 2016-07-18: 1 via ORAL
  Filled 2016-07-18: qty 1

## 2016-07-18 MED ORDER — PROMETHAZINE HCL 25 MG PO TABS
12.5000 mg | ORAL_TABLET | Freq: Four times a day (QID) | ORAL | Status: DC | PRN
Start: 2016-07-18 — End: 2016-07-20

## 2016-07-18 MED ORDER — ACETAMINOPHEN 325 MG PO TABS: 650.0000 mg | ORAL_TABLET | Freq: Four times a day (QID) | ORAL | Status: DC | PRN

## 2016-07-18 MED ORDER — ALBUTEROL SULFATE (2.5 MG/3ML) 0.083% IN NEBU: 2.5000 mg | INHALATION_SOLUTION | Freq: Four times a day (QID) | RESPIRATORY_TRACT | Status: DC | PRN

## 2016-07-18 MED ORDER — KETOROLAC TROMETHAMINE 15 MG/ML IJ SOLN
15.0000 mg | Freq: Four times a day (QID) | INTRAMUSCULAR | Status: DC
  Administered 2016-07-19 – 2016-07-20 (×6): 15 mg via INTRAVENOUS
  Filled 2016-07-18 (×6): qty 1

## 2016-07-18 MED ORDER — SODIUM CHLORIDE 0.9 % IV BOLUS (SEPSIS)
1000.0000 mL | Freq: Once | INTRAVENOUS | Status: AC
  Administered 2016-07-18: 1000 mL via INTRAVENOUS

## 2016-07-18 MED ORDER — LEVOTHYROXINE SODIUM 125 MCG PO TABS
125.0000 ug | ORAL_TABLET | Freq: Every day | ORAL | Status: DC
  Administered 2016-07-19 – 2016-07-20 (×2): 125 ug via ORAL
  Filled 2016-07-18 (×2): qty 1

## 2016-07-18 MED ORDER — ACETAMINOPHEN 650 MG RE SUPP: 650.0000 mg | Freq: Four times a day (QID) | RECTAL | Status: DC | PRN

## 2016-07-18 MED ORDER — PANTOPRAZOLE SODIUM 40 MG PO TBEC: 40.0000 mg | DELAYED_RELEASE_TABLET | Freq: Every day | ORAL | Status: DC

## 2016-07-18 MED ORDER — METHYLPREDNISOLONE SODIUM SUCC 40 MG IJ SOLR
40.0000 mg | Freq: Four times a day (QID) | INTRAMUSCULAR | Status: DC
  Administered 2016-07-19 – 2016-07-20 (×6): 40 mg via INTRAVENOUS
  Filled 2016-07-18 (×6): qty 1

## 2016-07-18 NOTE — ED Notes (Signed)
Attempted report, 6N states patient cannot come up until 11, ed charge nurse notified, patient notified in delay and is understanding.

## 2016-07-18 NOTE — H&P (Signed)
History and Physical    Valerie Marquez HGD:[id number] DOB: 1995/02/27 DOA: 07/18/2016  PCP: Valerie Fam, MD   Patient coming from: Home  Chief Complaint: Painful rash, fevers, loose stools, cough  HPI: Valerie Marquez is a 21 y.o. female with medical history significant for hypertension, type 2 diabetes mellitus, hypothyroidism, GERD, and chronic idiopathic erythema nodosum on Enbrel, now presenting to the emergency department with painful rash, fevers, loose stools, and cough. Patient has been suffering from recurrent erythema nodosum for the past 4 years and is followed by rheumatology and dermatology for this, managed on an oral for the past several months with some improvement initially. Over the past 5-6 weeks, she has been on prednisone with attempts at taper met with worsening in her rash. She has had intermittent fevers throughout this interval. Over the past 6 days, her condition has continuously worsened with fevers becoming more frequent and constant and her rash more widespread and painful. She reports several days of chest congestion and a cough which she has been attributing to the weather. She also notes mild general abdominal discomfort with 3 or 4 episodes of diarrhea daily for the past several days. She was recently being treated with Ancef with unclear indication. She denies any significant dyspnea and denies any abdominal pain at this time. No headaches or neck stiffness. Denies dysuria or flank pain. She was evaluated by her outpatient clinician, and in the setting of continued worsening despite treatment with prednisone, she was directed to the ED for IV steroids.    ED Course: Upon arrival to the ED, patient is found to be febrile to 38.4, saturating well on room air, tachycardic to 1:30, and with blood pressure 148/107. Chest x-ray is negative for acute cardiopulmonary disease. Chemistry panel is largely unremarkable and CBC is notable for a leukocytosis to 19,200,  thrombocytosis with platelets 425,000, and a stable microcytic anemia with hemoglobin of 10.7 and MCV of 75.7. CRP is elevated to 6.4 and lactic acid is slightly elevated to 1.99. Urinalysis features trace leukocytes but is otherwise unremarkable. Blood and urine cultures were obtained, 1 L of normal saline was given, and the patient was treated with 125 mg IV Solu-Medrol, Advil, Percocet. She continues to be febrile and reports spreading of the rash while in the ED. She will be admitted to the medical surgical unit for ongoing evaluation and management of acute erythema nodosum with unclear precipitant.  Review of Systems:  All other systems reviewed and apart from HPI, are negative.  Past Medical History:  Diagnosis Date  . Abnormal liver function tests   . Abnormal thyroid function test   . Acanthosis   . Allergic rhinitis   . Asthma   . Combined hyperlipidemia   . Concussion   . Diabetes mellitus ([place])   . Diabetes mellitus without complication ([place])   . Dyspepsia   . Eczema   . Erythema nodosum   . Gastroesophageal reflux   . Goiter    hypothyroidsm--hashimoto's  . Hypertension   . Iron deficiency anemia   . Lactose intolerance   . Obesity   . Prediabetes   . Psoriatic arthritis [facility])     Past Surgical History:  Procedure Laterality Date  . ANKLE SURGERY Right    --bone spur  . KNEE SURGERY Left 2014   repair of ACL  . sinuses    . TONSILLECTOMY AND ADENOIDECTOMY    . TYMPANOSTOMY TUBE PLACEMENT    . WISDOM TOOTH EXTRACTION  reports that she has never smoked. She has never used smokeless tobacco. She reports that she does not drink alcohol or use drugs.  Allergies  Allergen Reactions  . Corn-Containing Products Itching  . Peanut-Containing Drug Products Itching  . Soy Allergy Itching  . Amoxicillin Nausea And Vomiting and Rash  . Other Itching and Other (See Comments)    Also, tree nuts, cats, and dogs (pet dander) - per allergy test    Family History    Problem Relation Age of Onset  . Diabetes Mother   . Lupus Mother   . Hyperlipidemia Father   . Hypertension Father   . Diabetes Father   . Colon cancer Father   . Thyroid disease Brother   . Eczema Brother   . Hyperlipidemia Brother   . Diabetes Brother        pre diabetic  . Cholelithiasis Maternal Grandmother   . Diabetes Maternal Grandmother   . Cervical cancer Maternal Grandmother   . Stroke Maternal Grandmother   . Cancer Maternal Grandfather   . Diabetes Maternal Grandfather   . Stroke Maternal Grandfather   . Diabetes Paternal Grandmother   . Diabetes Paternal Grandfather      Prior to Admission medications   Medication Sig Start Date End Date Taking? Authorizing Provider  acetaminophen (TYLENOL) 500 MG tablet Take 1,000 mg by mouth every 4 (four) hours as needed for fever (alternating with ibuprofen). Reported on 01/15/2015   Yes [provider]  albuterol (PROAIR HFA) 108 (90 Base) MCG/ACT inhaler Inhale two puffs every four to six hours as needed for cough or wheeze. Patient taking differently: Inhale 2 puffs into the lungs every 4 (four) hours as needed (wheezing, shortness of breath, or cough).  01/14/16  Yes Kozlow, Donnamarie Poag, MD  amphetamine-dextroamphetamine (ADDERALL XR) 20 MG 24 hr capsule Take 20 mg by mouth daily. 06/18/16  Yes [provider]  B Complex-C (B-COMPLEX WITH VITAMIN C) tablet Take 1 tablet by mouth daily.    Yes [provider]  calcium-vitamin D (CALCIUM 500/D) 500-200 MG-UNIT per tablet Take 1 tablet by mouth daily.    Yes [provider]  cephALEXin (KEFLEX) 500 MG capsule Take 500 mg by mouth 3 (three) times daily. 10 day course filled 07/08/16 07/08/16  Yes [provider]  cyproheptadine (PERIACTIN) 4 MG tablet Take 4 mg by mouth at bedtime.  04/30/16  Yes [provider]  desonide (DESOWEN) 0.05 % cream apply to affected area once daily if needed 06/17/16  Yes Kozlow, Donnamarie Poag, MD  EPINEPHrine  (EPIPEN 2-PAK) 0.3 mg/0.3 mL IJ SOAJ injection Use as directed for life-threatening allergic reaction. Patient taking differently: Inject 0.3 mg into the muscle once as needed (life-threatening allergic reaction).  01/16/15  Yes Kozlow, Donnamarie Poag, MD  Etanercept (ENBREL MINI) 50 MG/ML SOCT Inject 50 mg into the skin every Monday.   Yes [provider]  ezetimibe (ZETIA) 10 MG tablet TAKE 1 TABLET DAILY 04/10/16  Yes Lelon Huh, MD  fluticasone furoate-vilanterol (BREO ELLIPTA) 200-25 MCG/INH AEPB Inhale 1 puff into the lungs daily. Rinse, gargle, and spit after use. 06/10/16  Yes Kozlow, Donnamarie Poag, MD  ibuprofen (ADVIL,MOTRIN) 200 MG tablet Take 600 mg by mouth every 6 (six) hours as needed for fever (alternating with tylenol). Reported on 01/15/2015   Yes [provider]  levocetirizine (XYZAL) 5 MG tablet Take one tablet by mouth every evening if needed. Patient taking differently: Take 5 mg by mouth daily.  01/14/16  Yes Kozlow, Donnamarie Poag, MD  Levonorgestrel [facility]) 19.5 MG IUD by Intrauterine route once. Implanted January 2018   Yes [provider]  metFORMIN (GLUCOPHAGE) 1000 MG tablet TAKE 1 TABLET TWICE A DAY WITH A MEAL 02/03/16  Yes Sherrlyn Hock, MD  Multiple Vitamin (MULTIVITAMIN WITH MINERALS) TABS tablet Take 1 tablet by mouth daily.   Yes [provider]  Olopatadine HCl (PATADAY) 0.2 % SOLN Use one drop in each eye once daily as needed. Patient taking differently: Place 1 drop into both eyes daily as needed (seasonal allergies).  01/14/16  Yes Kozlow, Donnamarie Poag, MD  pantoprazole (PROTONIX) 40 MG tablet Take 1 tablet (40 mg total) by mouth 2 (two) times daily. 01/17/16  Yes Hermenia Bers, NP  predniSONE (DELTASONE) 5 MG tablet Take 5-15 mg by mouth See admin instructions. Start date 07/10/16: take 3 tablets (15 mg) by mouth daily for 3 days, then take 2 tablets (10 mg) daily for 3 days, then take 1 tablet (5 mg) daily for 3 days, then stop 07/09/16  Yes  [provider]  SYNTHROID 125 MCG tablet TAKE 1 TABLET DAILY Patient taking differently: Take 125 mcg by mouth once a day 02/10/16  Yes Lelon Huh, MD  Vitamin D, Ergocalciferol, (DRISDOL) 50000 units CAPS capsule take 1 capsule by mouth ONCE A WEEK Patient taking differently: take 1 capsule by mouth ONCE A WEEK on MONDAYS 01/21/16  Yes Lelon Huh, MD  albuterol (PROVENTIL) (2.5 MG/3ML) 0.083% nebulizer solution Take 2.5 mg by nebulization every 6 (six) hours as needed for wheezing. Reported on 01/15/2015    [provider]  azelastine (OPTIVAR) 0.05 % ophthalmic solution Place 1 drop into both eyes 2 (two) times daily. Patient not taking: Reported on 07/18/2016 04/15/15   Jiles Prows, MD  NOVOFINE 32G X 6 MM MISC USE 1 NEEDLE DAILY 03/20/16   Lelon Huh, MD  promethazine (PHENERGAN) 25 MG tablet Take 1 tablet (25 mg total) by mouth every 6 (six) hours as needed for nausea or vomiting. 07/11/16 07/14/16  Nathaniel Man, MD    Physical Exam: Vitals:   07/18/16 1701 07/18/16 1915  BP: (!) 148/107 138/88  Pulse: (!) 130 (!) 108  Resp: 18   Temp: 98.9 F (37.2 C) (!) 101.2 F (38.4 C)  TempSrc: Oral   SpO2: 98% 100%      Constitutional: NAD, calm, in apparent discomfort.  Eyes: PERTLA, lids and conjunctivae normal ENMT: Mucous membranes are moist. Posterior pharynx clear of any exudate or lesions.   Neck: normal, supple, no masses, no thyromegaly Respiratory: clear to auscultation bilaterally, no wheezing, no crackles. Normal respiratory effort.   Cardiovascular: Rate ~110 and regular. No extremity edema. No significant JVD. Abdomen: No distension, no tenderness, no masses palpated. Bowel sounds normal.  Musculoskeletal: no clubbing / cyanosis. No joint deformity upper and lower extremities.   Skin: Tender erythematous nodules scattered about the extremities and trunk. Skin is otherwise warm, dry, well-perfused. Neurologic: CN 2-12 grossly intact.  Sensation intact, DTR normal. Strength 5/5 in all 4 limbs.  Psychiatric: Alert and oriented x 3. Calm and cooperative.     Labs on Admission: I have personally reviewed following labs and imaging studies  CBC:  Recent Labs Lab 07/18/16 1707  WBC 19.2*  NEUTROABS 17.1*  HGB 10.7*  HCT 34.9*  MCV 75.7*  PLT 536*   Basic Metabolic Panel:  Recent Labs Lab 07/18/16 1707  NA 135  K 4.2  CL 104  CO2 21*  GLUCOSE 116*  BUN 10  CREATININE 0.65  CALCIUM 8.7*   GFR: Estimated Creatinine Clearance: 141.4 mL/min (by C-G formula based on SCr of 0.65 mg/dL). Liver Function Tests:  Recent Labs Lab 07/18/16 1707  AST 25  ALT 37  ALKPHOS 32*  BILITOT 0.4  PROT 6.7  ALBUMIN 3.2*   No results for input(s): LIPASE, AMYLASE in the last 168 hours. No results for input(s): AMMONIA in the last 168 hours. Coagulation Profile: No results for input(s): INR, PROTIME in the last 168 hours. Cardiac Enzymes: No results for input(s): CKTOTAL, CKMB, CKMBINDEX, TROPONINI in the last 168 hours. BNP (last 3 results) No results for input(s): PROBNP in the last 8760 hours. HbA1C: No results for input(s): HGBA1C in the last 72 hours. CBG: No results for input(s): GLUCAP in the last 168 hours. Lipid Profile: No results for input(s): CHOL, HDL, LDLCALC, TRIG, CHOLHDL, LDLDIRECT in the last 72 hours. Thyroid Function Tests: No results for input(s): TSH, T4TOTAL, FREET4, T3FREE, THYROIDAB in the last 72 hours. Anemia Panel: No results for input(s): VITAMINB12, FOLATE, FERRITIN, TIBC, IRON, RETICCTPCT in the last 72 hours. Urine analysis:    Component Value Date/Time   COLORURINE YELLOW 07/18/2016 1855   APPEARANCEUR HAZY (A) 07/18/2016 1855   LABSPEC 1.027 07/18/2016 1855   PHURINE 6.0 07/18/2016 1855   GLUCOSEU NEGATIVE 07/18/2016 1855   HGBUR NEGATIVE 07/18/2016 1855   BILIRUBINUR NEGATIVE 07/18/2016 1855   BILIRUBINUR neg 01/14/2016 1559   KETONESUR NEGATIVE 07/18/2016 1855    PROTEINUR 30 (A) 07/18/2016 1855   UROBILINOGEN negative 01/14/2016 1559   UROBILINOGEN 1.0 11/13/2014 1601   NITRITE NEGATIVE 07/18/2016 1855   LEUKOCYTESUR TRACE (A) 07/18/2016 1855   Sepsis Labs: _0 (procalcitonin:4,lacticidven:4) )No results found for this or any previous visit (from the past 240 hour(s)).   Radiological Exams on Admission: Dg Chest 2 View  Result Date: 07/18/2016 CLINICAL DATA:  21 year old female with cough and cold symptoms. EXAM: CHEST  2 VIEW COMPARISON:  Chest radiograph dated 11/13/2014 FINDINGS: The heart size and mediastinal contours are within normal limits. Both lungs are clear. The visualized skeletal structures are unremarkable. IMPRESSION: No active cardiopulmonary disease. Electronically Signed   By: Anner Crete M.D.   On: 07/18/2016 20:40    EKG: Not performed.   Assessment/Plan  1. Erythema nodosum, acute  - Pt is followed by rheumatology for chronic, idiopathic EN, managed with Enbral  - Acute flares typically respond to prednisone taper, but the current episode has continued to progress despite this, prompting her rheumatologist to direct her to hospital for IV glucocorticoid  - She meets SIRS criteria on admission; no clear infectious process, likely secondary to acute EN - Treated in ED with 125 mg IV Solu-Medrol, Advil, Percocet, and NS bolus   - Plan to obtain blood, urine, sputum cultures, check resp viral panel given cough and congestion, check GI panel and C diff in light of diarrhea and recent abx  - Continue IV Solu-Medrol 40 mg q6h, analgesia, antipyretics, infection-surveillance    2. Hypertension with hypertensive urgency  - Pt with documented hx of HTN, not on any home medications for this   - Pain and Adderall may be contributing - Hold Adderall, treat pain, use hydralazine IVP's prn    3. Hypothyroidism  - Continue Synthroid   4. Type II DM  - A1c was only 5.3% in March 2018 - Managed with metformin at home, will  hold  - She will be on systemic steroids as above  -  Check CBG with meals and qHS - Start a low-intensity SSI    5. GERD - Stable, no EGD report on file   - Managed at home with Protonix BID, will continue    6. Diarrhea - No abd pain or travel; recently on Keflex  - GI pathogen panel and C diff assay pending, maintain enteric precautions for now   7. Cough, congestion  - Pt reports a week of mild cough and congestion - As above, will check resp virus panel     DVT prophylaxis: sq Lovenox Code Status: Full  Family Communication: Mother updated at bedside with patient's permission Disposition Plan: Admit to med-srug Consults called: None Admission status: Inpatient    Vianne Bulls, MD [facility] Pager [phone]  If 7PM-7AM, please contact night-coverage [email or web address] Password [facility]  07/18/2016, 9:48 PM

## 2016-07-18 NOTE — ED Provider Notes (Signed)
[facility]-EMERGENCY DEPT Provider Note   CSN: [id number] Arrival date & time: 07/18/16  1644     History   Chief Complaint Chief Complaint  Patient presents with  . Rash    HPI Valerie Marquez is a 21 y.o. female.  21yo F w/ PMH including T2DM, erythema nodosum, eczema, GERD who p/w worsening erythema nodosum flare. PT has been on varying amounts of steroids at home for the past 5-6 weeks for erythema nodosum. She saw her dermatologist in one week ago and after time was doing very well with few areas of rash, they started her on 15mg  prednisone taper at that time. She saw her pediatrician last week because she was not feeling well and was started on Keflex, which she is still taking. Approximately 5 days ago, her rash began to worsen and 3 days ago she contacted her dermatologist again, who recommended going back up to 30 mg. She has been taking prednisone at this level with no improvement in her symptoms. She reports that her rash has now spread to her arms and abdomen. She reports associated pain at the sites of rash as well as in her joints diffusely. No focal joint swelling. She reports fevers of 99-101 for the past 3 weeks. She reports cough and congestion, nausea and diarrhea but no vomiting. No urinary symptoms.   The history is provided by the patient.    Past Medical History:  Diagnosis Date  . Abnormal liver function tests   . Abnormal thyroid function test   . Acanthosis   . Allergic rhinitis   . Asthma   . Combined hyperlipidemia   . Concussion   . Diabetes mellitus (HCC)   . Diabetes mellitus without complication (HCC)   . Dyspepsia   . Eczema   . Erythema nodosum   . Gastroesophageal reflux   . Goiter    hypothyroidsm--hashimoto's  . Hypertension   . Iron deficiency anemia   . Lactose intolerance   . Obesity   . Prediabetes   . Psoriatic arthritis [facility])     Patient Active Problem List   Diagnosis Date Noted  . Erythema nodosum, acute form 07/18/2016  .  Loose stools 07/18/2016  . Precordial chest pain 11/13/2014  . Erythema nodosum   . Microscopic hematuria   . Vitamin D deficiency 05/24/2014  . Eczema of both hands 05/24/2014  . Menorrhagia 11/10/2012  . RUQ abdominal pain 11/09/2012  . Abnormal findings on diagnostic imaging of gall bladder 11/09/2012  . Type 2 diabetes mellitus (HCC) 04/07/2012  . Hypothyroid 01/04/2012  . Iron deficiency anemia 01/04/2012  . Acanthosis   . Goiter   . Abnormal thyroid function test   . Combined hyperlipidemia   . Dyspepsia   . Hypertension   . Abnormal transaminases   . Mild persistent asthma   . Gastroesophageal reflux   . Obesity 06/30/2010  . Mixed hyperlipidemia 06/30/2010    Past Surgical History:  Procedure Laterality Date  . ANKLE SURGERY Right    --bone spur  . KNEE SURGERY Left 2014   repair of ACL  . sinuses    . TONSILLECTOMY AND ADENOIDECTOMY    . TYMPANOSTOMY TUBE PLACEMENT    . WISDOM TOOTH EXTRACTION      OB History    Gravida Para Term Preterm AB Living   0 0 0 0 0 0   SAB TAB Ectopic Multiple Live Births   0 0 0 0 0       Home Medications  Prior to Admission medications   Medication Sig Start Date End Date Taking? Authorizing Provider  acetaminophen (TYLENOL) 500 MG tablet Take 1,000 mg by mouth every 4 (four) hours as needed for fever (alternating with Ibuprofen). Reported on 01/15/2015   Yes [provider]  albuterol (PROAIR HFA) 108 (90 Base) MCG/ACT inhaler Inhale two puffs every four to six hours as needed for cough or wheeze. Patient taking differently: Inhale 2 puffs into the lungs every 4 (four) hours as needed (wheezing, shortness of breath, or cough).  01/14/16  Yes Kozlow, Alvira PhilipsEric J, MD  albuterol (PROVENTIL) (2.5 MG/3ML) 0.083% nebulizer solution Take 2.5 mg by nebulization every 6 (six) hours as needed for wheezing. Reported on 01/15/2015   Yes [provider]  amphetamine-dextroamphetamine (ADDERALL XR) 20 MG 24 hr capsule Take  20 mg by mouth daily. 06/18/16  Yes [provider]  B Complex-C (B-COMPLEX WITH VITAMIN C) tablet Take 1 tablet by mouth daily.    Yes [provider]  calcium-vitamin D (CALCIUM 500/D) 500-200 MG-UNIT per tablet Take 1 tablet by mouth daily.    Yes [provider]  cephALEXin (KEFLEX) 500 MG capsule Take 500 mg by mouth 3 (three) times daily. For 10 days 07/08/16  Yes [provider]  cyproheptadine (PERIACTIN) 4 MG tablet Take 4 mg by mouth at bedtime.  04/30/16  Yes [provider]  desonide (DESOWEN) 0.05 % cream apply to affected area once daily if needed Patient taking differently: Apply to affected areas once a day as needed for itchy skin 06/17/16  Yes Kozlow, Alvira PhilipsEric J, MD  EPINEPHrine (EPIPEN 2-PAK) 0.3 mg/0.3 mL IJ SOAJ injection Use as directed for life-threatening allergic reaction. Patient taking differently: Inject 0.3 mg into the muscle once as needed (life-threatening allergic reaction).  01/16/15  Yes Kozlow, Alvira PhilipsEric J, MD  Etanercept (ENBREL MINI) 50 MG/ML SOCT Inject 50 mg into the skin every Monday.   Yes [provider]  ezetimibe (ZETIA) 10 MG tablet TAKE 1 TABLET DAILY Patient taking differently: Take 10 mg by mouth once a day 04/10/16  Yes Dessa PhiBadik, Jennifer, MD  fluticasone furoate-vilanterol (BREO ELLIPTA) 200-25 MCG/INH AEPB Inhale 1 puff into the lungs daily. Rinse, gargle, and spit after use. 06/10/16  Yes Kozlow, Alvira PhilipsEric J, MD  ibuprofen (ADVIL,MOTRIN) 200 MG tablet Take 600 mg by mouth every 6 (six) hours as needed for fever (alternating with Tylenol). Reported on 01/15/2015   Yes [provider]  levocetirizine (XYZAL) 5 MG tablet Take one tablet by mouth every evening if needed. Patient taking differently: Take 5 mg by mouth daily.  01/14/16  Yes Kozlow, Alvira PhilipsEric J, MD  Levonorgestrel [facility](KYLEENA) 19.5 MG IUD by Intrauterine route once. Implanted January 2018   Yes [provider]  metFORMIN (GLUCOPHAGE) 1000 MG tablet  TAKE 1 TABLET TWICE A DAY WITH A MEAL Patient taking differently: Take 1,000 mg by mouth two times a day with a meal 02/03/16  Yes David StallBrennan, Michael J, MD  Multiple Vitamin (MULTIVITAMIN WITH MINERALS) TABS tablet Take 1 tablet by mouth daily.   Yes [provider]  Olopatadine HCl (PATADAY) 0.2 % SOLN Use one drop in each eye once daily as needed. Patient taking differently: Place 1 drop into both eyes daily as needed (for seasonal allergies).  01/14/16  Yes Kozlow, Alvira PhilipsEric J, MD  pantoprazole (PROTONIX) 40 MG tablet Take 1 tablet (40 mg total) by mouth 2 (two) times daily. 01/17/16  Yes Gretchen ShortBeasley, Spenser, NP  predniSONE (DELTASONE) 5 MG tablet Take  5-15 mg by mouth See admin instructions. Start date 07/10/16: take 3 tablets (15 mg) by mouth daily for 3 days, then take 2 tablets (10 mg) daily for 3 days, then take 1 tablet (5 mg) daily for 3 days, then stop 07/09/16  Yes [provider]  SYNTHROID 125 MCG tablet TAKE 1 TABLET DAILY Patient taking differently: Take 125 mcg by mouth once a day 02/10/16  Yes Dessa Phi, MD  Vitamin D, Ergocalciferol, (DRISDOL) 50000 units CAPS capsule take 1 capsule by mouth ONCE A WEEK Patient taking differently: Take 50,000 units by mouth once a week 01/21/16  Yes Dessa Phi, MD  NOVOFINE 32G X 6 MM MISC USE 1 NEEDLE DAILY 03/20/16   Dessa Phi, MD  promethazine (PHENERGAN) 25 MG tablet Take 1 tablet (25 mg total) by mouth every 6 (six) hours as needed for nausea or vomiting. 07/11/16 07/14/16  Corena Herter, MD    Family History Family History  Problem Relation Age of Onset  . Diabetes Mother   . Lupus Mother   . Hyperlipidemia Father   . Hypertension Father   . Diabetes Father   . Colon cancer Father   . Thyroid disease Brother   . Eczema Brother   . Hyperlipidemia Brother   . Diabetes Brother        pre diabetic  . Cholelithiasis Maternal Grandmother   . Diabetes Maternal Grandmother   . Cervical cancer Maternal Grandmother   .  Stroke Maternal Grandmother   . Cancer Maternal Grandfather   . Diabetes Maternal Grandfather   . Stroke Maternal Grandfather   . Diabetes Paternal Grandmother   . Diabetes Paternal Grandfather     Social History Social History  Substance Use Topics  . Smoking status: Never Smoker  . Smokeless tobacco: Never Used  . Alcohol use No     Allergies   Corn-containing products; Peanut-containing drug products; Soy allergy; Amoxicillin; and Other   Review of Systems Review of Systems All other systems reviewed and are negative except that which was mentioned in HPI   Physical Exam Updated Vital Signs BP (!) 147/89 (BP Location: Left Arm)   Pulse 91   Temp 98.5 F (36.9 C) (Oral)   Resp 18   Ht 5\' 7"  (1.702 m)   Wt 108.9 kg (240 lb)   LMP 07/07/2016   SpO2 96%   BMI 37.59 kg/m   Physical Exam  Constitutional: She is oriented to person, place, and time. She appears well-developed and well-nourished. No distress.  HENT:  Head: Normocephalic and atraumatic.  Moist mucous membranes  Eyes: Conjunctivae are normal. Pupils are equal, round, and reactive to light.  Neck: Neck supple.  Cardiovascular: Normal rate, regular rhythm and normal heart sounds.   No murmur heard. Pulmonary/Chest: Effort normal and breath sounds normal.  Abdominal: Soft. Bowel sounds are normal. She exhibits no distension. There is no tenderness.  Musculoskeletal: She exhibits no edema.  Neurological: She is alert and oriented to person, place, and time.  Fluent speech  Skin: Skin is warm and dry. Rash noted.  Nodular rash on legs, arms and occasionally on abdomen--see photo  Psychiatric: She has a normal mood and affect. Judgment normal.  Nursing note and vitals reviewed.      ED Treatments / Results  Labs (all labs ordered are listed, but only abnormal results are displayed) Labs Reviewed  COMPREHENSIVE METABOLIC PANEL - Abnormal; Notable for the following:       Result Value   CO2 21 (*)  Glucose, Bld 116 (*)    Calcium 8.7 (*)    Albumin 3.2 (*)    Alkaline Phosphatase 32 (*)    All other components within normal limits  CBC WITH DIFFERENTIAL/PLATELET - Abnormal; Notable for the following:    WBC 19.2 (*)    Hemoglobin 10.7 (*)    HCT 34.9 (*)    MCV 75.7 (*)    MCH 23.2 (*)    Platelets 425 (*)    Neutro Abs 17.1 (*)    All other components within normal limits  C-REACTIVE PROTEIN - Abnormal; Notable for the following:    CRP 6.4 (*)    All other components within normal limits  URINALYSIS, ROUTINE W REFLEX MICROSCOPIC - Abnormal; Notable for the following:    APPearance HAZY (*)    Protein, ur 30 (*)    Leukocytes, UA TRACE (*)    Squamous Epithelial / LPF 6-30 (*)    All other components within normal limits  I-STAT CG4 LACTIC ACID, ED - Abnormal; Notable for the following:    Lactic Acid, Venous 1.99 (*)    All other components within normal limits  CULTURE, BLOOD (ROUTINE X 2)  CULTURE, BLOOD (ROUTINE X 2)  URINE CULTURE  CULTURE, EXPECTORATED SPUTUM-ASSESSMENT  RESPIRATORY PANEL BY PCR  C DIFFICILE QUICK SCREEN W PCR REFLEX  GASTROINTESTINAL PANEL BY PCR, STOOL (REPLACES STOOL CULTURE)  SEDIMENTATION RATE  HIV ANTIBODY (ROUTINE TESTING)  CBC WITH DIFFERENTIAL/PLATELET  BASIC METABOLIC PANEL  SEDIMENTATION RATE  C-REACTIVE PROTEIN  POC URINE PREG, ED  I-STAT CG4 LACTIC ACID, ED    EKG  EKG Interpretation None       Radiology Dg Chest 2 View  Result Date: 07/18/2016 CLINICAL DATA:  21 year old female with cough and cold symptoms. EXAM: CHEST  2 VIEW COMPARISON:  Chest radiograph dated 11/13/2014 FINDINGS: The heart size and mediastinal contours are within normal limits. Both lungs are clear. The visualized skeletal structures are unremarkable. IMPRESSION: No active cardiopulmonary disease. Electronically Signed   By: Elgie Collard M.D.   On: 07/18/2016 20:40    Procedures Procedures (including critical care time)  Medications  Ordered in ED Medications  ketorolac (TORADOL) 15 MG/ML injection 15 mg (not administered)  methylPREDNISolone sodium succinate (SOLU-MEDROL) 40 mg/mL injection 40 mg (not administered)  0.9 %  sodium chloride infusion (not administered)  enoxaparin (LOVENOX) injection 40 mg (not administered)  sodium chloride flush (NS) 0.9 % injection 3 mL (not administered)  acetaminophen (TYLENOL) tablet 650 mg (not administered)    Or  acetaminophen (TYLENOL) suppository 650 mg (not administered)  oxyCODONE (Oxy IR/ROXICODONE) immediate release tablet 5-10 mg (not administered)  promethazine (PHENERGAN) tablet 12.5 mg (not administered)  hydrALAZINE (APRESOLINE) injection 10 mg (not administered)  amphetamine-dextroamphetamine (ADDERALL XR) 24 hr capsule 20 mg (not administered)  multivitamin with minerals tablet 1 tablet (not administered)  fluticasone furoate-vilanterol (BREO ELLIPTA) 200-25 MCG/INH 1 puff (not administered)  cyproheptadine (PERIACTIN) 4 MG tablet 4 mg (not administered)  ezetimibe (ZETIA) tablet 10 mg (not administered)  levothyroxine (SYNTHROID, LEVOTHROID) tablet 125 mcg (not administered)  pantoprazole (PROTONIX) EC tablet 40 mg (not administered)  cetirizine (ZYRTEC) tablet 10 mg (not administered)  calcium-vitamin D (OSCAL WITH D) 500-200 MG-UNIT per tablet 1 tablet (not administered)  albuterol (PROVENTIL) (2.5 MG/3ML) 0.083% nebulizer solution 2.5 mg (not administered)  B-complex with vitamin C tablet 1 tablet (not administered)  oxyCODONE-acetaminophen (PERCOCET/ROXICET) 5-325 MG per tablet 1 tablet (1 tablet Oral Given 07/18/16 1917)  methylPREDNISolone sodium succinate (SOLU-MEDROL)  125 mg/2 mL injection 125 mg (125 mg Intravenous Given 07/18/16 1917)  sodium chloride 0.9 % bolus 1,000 mL (0 mLs Intravenous Stopped 07/18/16 2108)  ibuprofen (ADVIL,MOTRIN) tablet 800 mg (800 mg Oral Given 07/18/16 2001)     Initial Impression / Assessment and Plan / ED Course  I have  reviewed the triage vital signs and the nursing notes.  Pertinent labs & imaging results that were available during my care of the patient were reviewed by me and considered in my medical decision making (see chart for details).    Pt w/ erythema nodosum, Recently increased steroids but with worsening symptoms. She was sent here by her pediatrician for IV steroids. She was nontoxic on exam, initial vital signs notable for tachycardia at heart rate 130, BP 148/107, afebrile. Rash noted as above. Obtained above lab work including inflammatory markers and gave the patient Solu-Medrol and Percocet.  Patient later developed fever, added cultures and obtain lactate which was close to normal at 1.99. UA without evidence of infection and chest x-ray clear. Her WBC count is 19.2 which I suspect may be related to steroid use. Her inflammatory markers are also elevated. I reviewed her discharge summary from previous hospitalization which presents exactly the same clinical picture of worsening erythema nodosum associated with fevers. At that time she improved with steroids alone, cultures had been negative, and she did not require any antibiotics. Because her symptoms are exactly the same today and she has no focal signs of bacterial infection, I feel her fever is likely related to her autoimmune process and I have held on antibiotics at this time. Discussed admission with tight hospitalist, Dr. Antionette Char, and pt admitted for further care.  Final Clinical Impressions(s) / ED Diagnoses   Final diagnoses:  Fever, unspecified fever cause  Erythema nodosum, chronic form    New Prescriptions Current Discharge Medication List       Needham Biggins, Ambrose Finland, MD 07/19/16 [phone]

## 2016-07-18 NOTE — ED Triage Notes (Signed)
Pt to ER sent by PCP for acute flare up of erythema nodosum, rash noted to full body. States started approximately 6 days ago, can usually be treated with prednisone but states her PCP sent her here for IV steroids.

## 2016-07-19 ENCOUNTER — Encounter (HOSPITAL_COMMUNITY): Payer: Self-pay | Admitting: *Deleted

## 2016-07-19 DIAGNOSIS — R509 Fever, unspecified: Secondary | ICD-10-CM | POA: Diagnosis present

## 2016-07-19 DIAGNOSIS — L52 Erythema nodosum: Secondary | ICD-10-CM | POA: Insufficient documentation

## 2016-07-19 LAB — CBC WITH DIFFERENTIAL/PLATELET
BASOS ABS: 0 10*3/uL (ref 0.0–0.1)
Basophils Relative: 0 %
EOS ABS: 0 10*3/uL (ref 0.0–0.7)
EOS PCT: 0 %
HCT: 31.8 % — ABNORMAL LOW (ref 36.0–46.0)
Hemoglobin: 9.6 g/dL — ABNORMAL LOW (ref 12.0–15.0)
LYMPHS ABS: 1.1 10*3/uL (ref 0.7–4.0)
LYMPHS PCT: 6 %
MCH: 23.1 pg — AB (ref 26.0–34.0)
MCHC: 30.2 g/dL (ref 30.0–36.0)
MCV: 76.4 fL — AB (ref 78.0–100.0)
MONO ABS: 0.1 10*3/uL (ref 0.1–1.0)
Monocytes Relative: 1 %
Neutro Abs: 15.2 10*3/uL — ABNORMAL HIGH (ref 1.7–7.7)
Neutrophils Relative %: 93 %
PLATELETS: 390 10*3/uL (ref 150–400)
RBC: 4.16 MIL/uL (ref 3.87–5.11)
RDW: 14.5 % (ref 11.5–15.5)
WBC: 16.4 10*3/uL — AB (ref 4.0–10.5)

## 2016-07-19 LAB — RESPIRATORY PANEL BY PCR
ADENOVIRUS-RVPPCR: NOT DETECTED
Bordetella pertussis: NOT DETECTED
CHLAMYDOPHILA PNEUMONIAE-RVPPCR: NOT DETECTED
CORONAVIRUS 229E-RVPPCR: NOT DETECTED
CORONAVIRUS HKU1-RVPPCR: NOT DETECTED
CORONAVIRUS NL63-RVPPCR: NOT DETECTED
Coronavirus OC43: NOT DETECTED
Influenza A: NOT DETECTED
Influenza B: NOT DETECTED
Metapneumovirus: NOT DETECTED
Mycoplasma pneumoniae: NOT DETECTED
PARAINFLUENZA VIRUS 2-RVPPCR: NOT DETECTED
PARAINFLUENZA VIRUS 3-RVPPCR: NOT DETECTED
Parainfluenza Virus 1: NOT DETECTED
Parainfluenza Virus 4: NOT DETECTED
RHINOVIRUS / ENTEROVIRUS - RVPPCR: DETECTED — AB
Respiratory Syncytial Virus: NOT DETECTED

## 2016-07-19 LAB — BASIC METABOLIC PANEL
Anion gap: 10 (ref 5–15)
BUN: 10 mg/dL (ref 6–20)
CALCIUM: 8.6 mg/dL — AB (ref 8.9–10.3)
CO2: 23 mmol/L (ref 22–32)
Chloride: 104 mmol/L (ref 101–111)
Creatinine, Ser: 0.69 mg/dL (ref 0.44–1.00)
GFR calc Af Amer: >60 mL/min (ref >60)
GLUCOSE: 155 mg/dL — AB (ref 65–99)
POTASSIUM: 4.1 mmol/L (ref 3.5–5.1)
SODIUM: 137 mmol/L (ref 135–145)

## 2016-07-19 LAB — URINE CULTURE: CULTURE: NO GROWTH

## 2016-07-19 LAB — GLUCOSE, CAPILLARY
Glucose-Capillary: 173 mg/dL — ABNORMAL HIGH (ref 65–99)
Glucose-Capillary: 188 mg/dL — ABNORMAL HIGH (ref 65–99)

## 2016-07-19 LAB — SEDIMENTATION RATE: SED RATE: 51 mm/h — AB (ref 0–22)

## 2016-07-19 LAB — HIV ANTIBODY (ROUTINE TESTING W REFLEX): HIV SCREEN 4TH GENERATION: NONREACTIVE

## 2016-07-19 LAB — C-REACTIVE PROTEIN: CRP: 7.3 mg/dL — AB (ref <1.0)

## 2016-07-19 MED ORDER — INSULIN ASPART 100 UNIT/ML ~~LOC~~ SOLN
0.0000 [IU] | SUBCUTANEOUS | Status: DC
  Administered 2016-07-19 – 2016-07-20 (×2): 2 [IU] via SUBCUTANEOUS
  Administered 2016-07-20: 1 [IU] via SUBCUTANEOUS
  Administered 2016-07-20: 2 [IU] via SUBCUTANEOUS

## 2016-07-19 NOTE — Progress Notes (Signed)
PROGRESS NOTE    Valerie Marquez  JJK:[id number] DOB: [birth year] DOA: 07/18/2016 PCP: Monna Fam, MD     Brief Narrative:  21 y.o. WF PMHx HTN, DM type 2 , Hypothyroidism, GERD, and Chronic Idiopathic Erythema Nodosum on Enbrel, Psoriatic arthritis ,Eczema  Presenting to the emergency department with painful rash, fevers, loose stools, and cough. Patient has been suffering from recurrent erythema nodosum for the past 4 years and is followed by rheumatology and dermatology for this, managed on an oral for the past several months with some improvement initially. Over the past 5-6 weeks, she has been on prednisone with attempts at taper met with worsening in her rash. She has had intermittent fevers throughout this interval. Over the past 6 days, her condition has continuously worsened with fevers becoming more frequent and constant and her rash more widespread and painful. She reports several days of chest congestion and a cough which she has been attributing to the weather. She also notes mild general abdominal discomfort with 3 or 4 episodes of diarrhea daily for the past several days. She was recently being treated with Ancef with unclear indication. She denies any significant dyspnea and denies any abdominal pain at this time. No headaches or neck stiffness. Denies dysuria or flank pain. She was evaluated by her outpatient clinician, and in the setting of continued worsening despite treatment with prednisone, she was directed to the ED for IV steroids.    ED Course: Upon arrival to the ED, patient is found to be febrile to 38.4, saturating well on room air, tachycardic to 1:30, and with blood pressure 148/107. Chest x-ray is negative for acute cardiopulmonary disease. Chemistry panel is largely unremarkable and CBC is notable for a leukocytosis to 19,200, thrombocytosis with platelets 425,000, and a stable microcytic anemia with hemoglobin of 10.7 and MCV of 75.7. CRP is elevated to 6.4 and  lactic acid is slightly elevated to 1.99. Urinalysis features trace leukocytes but is otherwise unremarkable. Blood and urine cultures were obtained, 1 L of normal saline was given, and the patient was treated with 125 mg IV Solu-Medrol, Advil, Percocet. She continues to be febrile and reports spreading of the rash while in the ED. She will be admitted to the medical surgical unit for ongoing evaluation and management of acute erythema nodosum with unclear precipitant.   Subjective: 6/24 A/O 4, negative CP, negative SOB, decreased generalized pain from rash. Negative diarrhea, states last episode of diarrhea prior to admission.    Assessment & Plan:   Principal Problem:   Erythema nodosum Active Problems:   Gastroesophageal reflux   Hypertension   Mild persistent asthma   Hypothyroid   Type 2 diabetes mellitus (HCC)   Erythema nodosum, acute form   Loose stools   Erythema nodosum, chronic form   Fever    Erythema nodosum, acute  - Pt is followed by rheumatology for chronic, idiopathic EN, managed with Enbral  - Acute flares typically respond to prednisone taper, but the current episode has continued to progress despite this, prompting her rheumatologist to direct her to hospital for IV glucocorticoid  - She meets SIRS criteria on admission; no clear infectious process, likely secondary to acute EN - Solu-Medrol 40 mg QID: Symptoms beginning to resolve    - Respiratory virus panel positive rhinovirus see results below -GI panel canceled patient with negative diarrhea,   Hypertension with hypertensive urgency  - Resolved. Most likely secondary to pain. As pain resolved HTN resolved  -Hydralazine PRN  Hypothyroidism  -  Synthroid 125 g daily  Type II DM controlled without complication -05/9975 Hemoglobin A1c= 5.3%  - Managed with metformin at home, will hold  - She will be on systemic steroids as above  - Sensitive SSI   GERD - Stable, no EGD report on file   - Managed  at home with Protonix BID, will continue    Diarrhea - No abd pain or travel; recently on Keflex. Diarrhea resolved  - Protonix 40 mg BID FOR GI PROTECTION (HOME DOSE)     Cough, congestion positive Rhinovirus  - Pt reports a week of mild cough and congestion -Treat symptoms       DVT prophylaxis: Lovenox Code Status: Full Family Communication: None Disposition Plan: Resolution erythema nodosum   Consultants:  None  Procedures/Significant Events:  None  VENTILATOR SETTINGS: None   Cultures 6/23 urine pending 6/24 respiratory virus panel positive Rhinovirus / Enterovirus 6/24 blood NGTD     Antimicrobials: Anti-infectives    None       Devices None   LINES / TUBES:  None    Continuous Infusions:   Objective: Vitals:   07/18/16 2215 07/18/16 2349 07/19/16 0523 07/19/16 1457  BP: 123/68 (!) 147/89 133/70 (!) 105/53  Pulse: 95 91 60 90  Resp: _0 Temp: 98.6 F (37 C) 98.5 F (36.9 C) 97.7 F (36.5 C) 98.1 F (36.7 C)  TempSrc: Oral Oral Oral Oral  SpO2: 99% 96% 97% 98%  Weight:  240 lb (108.9 kg)    Height:  _1  (1.702 m)      Intake/Output Summary (Last 24 hours) at 07/19/16 1531 Last data filed at 07/19/16 1458  Gross per 24 hour  Intake          2086.25 ml  Output             1400 ml  Net           686.25 ml   Filed Weights   07/18/16 2349  Weight: 240 lb (108.9 kg)    Examination:  General: A/O 4, No acute respiratory distress Eyes: negative scleral hemorrhage, negative anisocoria, negative icterus ENT: Negative Runny nose, negative gingival bleeding, Neck:  Negative scars, masses, torticollis, lymphadenopathy, JVD Lungs: Clear to auscultation bilaterally without wheezes or crackles Cardiovascular: Morbidly obese, Regular rate and rhythm without murmur gallop or rub normal S1 and S2 Abdomen: negative abdominal pain, nondistended, positive soft, bowel sounds, no rebound, no ascites, no appreciable  mass Extremities: No significant cyanosis, clubbing, or edema bilateral lower extremities Skin: Positive lesions on legs arms and feet consistent with erythema nodosum, negative pain to palpation (resolving) Psychiatric:  Negative depression, negative anxiety, negative fatigue, negative mania  Central nervous system:  Cranial nerves II through XII intact, tongue/uvula midline, all extremities muscle strength 5/5, sensation intact throughout,  negative dysarthria, negative expressive aphasia, negative receptive aphasia.  .     Data Reviewed: Care during the described time interval was provided by me .  I have reviewed this patient's available data, including medical history, events of note, physical examination, and all test results as part of my evaluation. I have personally reviewed and interpreted all radiology studies.  CBC:  Recent Labs Lab 07/18/16 1707 07/19/16 0023  WBC 19.2* 16.4*  NEUTROABS 17.1* 15.2*  HGB 10.7* 9.6*  HCT 34.9* 31.8*  MCV 75.7* 76.4*  PLT 425* 414   Basic Metabolic Panel:  Recent Labs Lab 07/18/16 1707 07/19/16 0023  NA 135 137  K 4.2  4.1  CL 104 104  CO2 21* 23  GLUCOSE 116* 155*  BUN 10 10  CREATININE 0.65 0.69  CALCIUM 8.7* 8.6*   GFR: Estimated Creatinine Clearance: 141.4 mL/min (by C-G formula based on SCr of 0.69 mg/dL). Liver Function Tests:  Recent Labs Lab 07/18/16 1707  AST 25  ALT 37  ALKPHOS 32*  BILITOT 0.4  PROT 6.7  ALBUMIN 3.2*   No results for input(s): LIPASE, AMYLASE in the last 168 hours. No results for input(s): AMMONIA in the last 168 hours. Coagulation Profile: No results for input(s): INR, PROTIME in the last 168 hours. Cardiac Enzymes: No results for input(s): CKTOTAL, CKMB, CKMBINDEX, TROPONINI in the last 168 hours. BNP (last 3 results) No results for input(s): PROBNP in the last 8760 hours. HbA1C: No results for input(s): HGBA1C in the last 72 hours. CBG: No results for input(s): GLUCAP in the last  168 hours. Lipid Profile: No results for input(s): CHOL, HDL, LDLCALC, TRIG, CHOLHDL, LDLDIRECT in the last 72 hours. Thyroid Function Tests: No results for input(s): TSH, T4TOTAL, FREET4, T3FREE, THYROIDAB in the last 72 hours. Anemia Panel: No results for input(s): VITAMINB12, FOLATE, FERRITIN, TIBC, IRON, RETICCTPCT in the last 72 hours. Sepsis Labs:  Recent Labs Lab 07/18/16 1730 07/18/16 2001  LATICACIDVEN 1.99* 1.39    Recent Results (from the past 240 hour(s))  Culture, blood (routine x 2)     Status: None (Preliminary result)   Collection Time: 07/18/16  7:52 PM  Result Value Ref Range Status   Specimen Description BLOOD RIGHT ANTECUBITAL  Final   Special Requests   Final    BOTTLES DRAWN AEROBIC AND ANAEROBIC Blood Culture adequate volume   Culture NO GROWTH < 24 HOURS  Final   Report Status PENDING  Incomplete  Respiratory Panel by PCR     Status: Abnormal   Collection Time: 07/19/16  1:11 AM  Result Value Ref Range Status   Adenovirus NOT DETECTED NOT DETECTED Final   Coronavirus 229E NOT DETECTED NOT DETECTED Final   Coronavirus HKU1 NOT DETECTED NOT DETECTED Final   Coronavirus NL63 NOT DETECTED NOT DETECTED Final   Coronavirus OC43 NOT DETECTED NOT DETECTED Final   Metapneumovirus NOT DETECTED NOT DETECTED Final   Rhinovirus / Enterovirus DETECTED (A) NOT DETECTED Final   Influenza A NOT DETECTED NOT DETECTED Final   Influenza B NOT DETECTED NOT DETECTED Final   Parainfluenza Virus 1 NOT DETECTED NOT DETECTED Final   Parainfluenza Virus 2 NOT DETECTED NOT DETECTED Final   Parainfluenza Virus 3 NOT DETECTED NOT DETECTED Final   Parainfluenza Virus 4 NOT DETECTED NOT DETECTED Final   Respiratory Syncytial Virus NOT DETECTED NOT DETECTED Final   Bordetella pertussis NOT DETECTED NOT DETECTED Final   Chlamydophila pneumoniae NOT DETECTED NOT DETECTED Final   Mycoplasma pneumoniae NOT DETECTED NOT DETECTED Final         Radiology Studies: Dg Chest 2  View  Result Date: 07/18/2016 CLINICAL DATA:  21 year old female with cough and cold symptoms. EXAM: CHEST  2 VIEW COMPARISON:  Chest radiograph dated 11/13/2014 FINDINGS: The heart size and mediastinal contours are within normal limits. Both lungs are clear. The visualized skeletal structures are unremarkable. IMPRESSION: No active cardiopulmonary disease. Electronically Signed   By: Anner Crete M.D.   On: 07/18/2016 20:40        Scheduled Meds: . B-complex with vitamin C  1 tablet Oral Daily  . calcium-vitamin D  1 tablet Oral Daily  . cetirizine  10  mg Oral Daily  . cyproheptadine  4 mg Oral QHS  . enoxaparin (LOVENOX) injection  40 mg Subcutaneous Q24H  . ezetimibe  10 mg Oral Daily  . fluticasone furoate-vilanterol  1 puff Inhalation Daily  . ketorolac  15 mg Intravenous Q6H  . levothyroxine  125 mcg Oral QAC breakfast  . methylPREDNISolone (SOLU-MEDROL) injection  40 mg Intravenous Q6H  . multivitamin with minerals  1 tablet Oral Daily  . pantoprazole  40 mg Oral BID  . sodium chloride flush  3 mL Intravenous Q12H   Continuous Infusions:   LOS: 1 day    Time spent:40 min    WOODS, Geraldo Docker, MD [facility] Pager [phone]  If 7PM-7AM, please contact night-coverage [email or web address] Password [facility] 07/19/2016, 3:31 PM

## 2016-07-20 DIAGNOSIS — L52 Erythema nodosum: Principal | ICD-10-CM

## 2016-07-20 DIAGNOSIS — E782 Mixed hyperlipidemia: Secondary | ICD-10-CM

## 2016-07-20 DIAGNOSIS — A419 Sepsis, unspecified organism: Secondary | ICD-10-CM | POA: Diagnosis present

## 2016-07-20 DIAGNOSIS — R509 Fever, unspecified: Secondary | ICD-10-CM

## 2016-07-20 LAB — GLUCOSE, CAPILLARY
Glucose-Capillary: 161 mg/dL — ABNORMAL HIGH (ref 65–99)
Glucose-Capillary: 144 mg/dL — ABNORMAL HIGH (ref 65–99)

## 2016-07-20 LAB — C-REACTIVE PROTEIN: CRP: 2.9 mg/dL — ABNORMAL HIGH (ref <1.0)

## 2016-07-20 LAB — SEDIMENTATION RATE: Sed Rate: 50 mm/hr — ABNORMAL HIGH (ref 0–22)

## 2016-07-20 MED ORDER — PREDNISONE 20 MG PO TABS: 20.0000 mg | ORAL_TABLET | Freq: Every day | ORAL | 0 refills | Status: DC

## 2016-07-20 NOTE — Discharge Summary (Addendum)
Physician Discharge Summary  Valerie KanskySamantha M Marquez FAO:[id number] DOB: [birth year] DOA: 07/18/2016  PCP: Aggie HackerSumner, Brian, MD  Rheumatologist: Dr Camie PatienceAngela Hawes  Admit date: 07/18/2016 Discharge date: 07/20/2016  Admitted From: Home Disposition: Home  Recommendations for Outpatient Follow-up:  Follow up with Rheumatologist Dr Nickola MajorHawkes in 1-2 weeks. Patient will be discharged on oral prednisone 40 mg daily tapered and stop over the next 10-12 days.  Home Health:None Equipment/Devices: None  Discharge Condition: Fair CODE STATUS: Full code Diet recommendation:carb modified    Discharge Diagnoses:  Principal Problem:   Erythema nodosum, acute form   Active Problems: SIRS affecting skin (HCC)   Mixed hyperlipidemia   Gastroesophageal reflux   Combined hyperlipidemia   Hypertension   Mild persistent asthma   Hypothyroid   Type 2 diabetes mellitus (HCC)   Loose stools   Fever   Brief narrative/history of present illness 21 year old female with hypertension, type 2 diabetes mellitus, hypothyroidism, chronic idiopathic erythema nodosum on Enbrel, psoriatic arthritis and eczema presented to the ED with painful rash mainly involving her hospitalization legs. She reported having associated fevers with loose stools and cough. She was suffering from recurrent erythema nodosum for the past 4 years and is followed by rheumatologist Dr. Nickola MajorHawkes and dermatology as outpatient. She has been managed with oral prednisone for several months with response previously. Over the last 5-6 mL was on prednisone with attempts to taper but noted her rash to have worsened. Since 6 days prior to admission her rash started becoming more widespread and painful. This is associated with chest congestion and cough and abdominal discomfort with 3-4 episodes of diarrhea. She was also treated with Ancef recently for reasons unclear. In the ED she septic with fever of 101.51F, tachycardic, WBC of 19 K and elevated lactic acid. UA  negative for UTI. Blood and urine cultures sent and patient given IV normals any bolus followed by IV Solu-Medrol and admitted to medical floor for acute flareup of her erythema nodosum and need for IV Solu Medrol.  Hospital course SIRS affecting skin,  with acute flare of of erythema nodosum.   started on IV Solu Medrol 40 mg every 6 hours. Has responded well. No signs of infection. Blood and urine cultures are negative. Rash and pain in her legs have markedly improved and patient remains afebrile. Sepsis ruled out  and Leukocytosis improving.  -I have spoken with her rheumatologist Dr. Nickola MajorHawkes this morning and she agrees on discharging patient on a short prednisone taper and outpatient follow-up.  Essential hypertension with hypertensive urgency Possibly due to sepsis and pain. Now resolved. Continue home medications.  Hypothyroidism  continue Synthroid.  Type 2 diabetes mellitus, controlled On metformin at home, continue.  GERD Continue Protonix  Cough with congestion Respiratory panel positive for rhinovirus. Improved with supportive care.  Family communication: None at bedside procedure: None   Discharge Instructions   Allergies as of 07/20/2016      Reactions   Corn-containing Products Itching   Peanut-containing Drug Products Itching   Soy Allergy Itching   Amoxicillin Nausea And Vomiting, Rash   Other Itching, Other (See Comments)   Also, tree nuts, cats, and dogs (pet dander) - per allergy test      Medication List    STOP taking these medications   cephALEXin 500 MG capsule Commonly known as:  KEFLEX     TAKE these medications   acetaminophen 500 MG tablet Commonly known as:  TYLENOL Take 1,000 mg by mouth every 4 (four) hours as needed for  fever (alternating with Ibuprofen). Reported on 01/15/2015   albuterol 108 (90 Base) MCG/ACT inhaler Commonly known as:  PROAIR HFA Inhale two puffs every four to six hours as needed for cough or wheeze. What  changed:  how much to take  how to take this  when to take this  reasons to take this  additional instructions   albuterol (2.5 MG/3ML) 0.083% nebulizer solution Commonly known as:  PROVENTIL Take 2.5 mg by nebulization every 6 (six) hours as needed for wheezing. Reported on 01/15/2015 What changed:  Another medication with the same name was changed. Make sure you understand how and when to take each.   amphetamine-dextroamphetamine 20 MG 24 hr capsule Commonly known as:  ADDERALL XR Take 20 mg by mouth daily.   B-complex with vitamin C tablet Take 1 tablet by mouth daily.   CALCIUM 500/D 500-200 MG-UNIT tablet Generic drug:  calcium-vitamin D Take 1 tablet by mouth daily.   cyproheptadine 4 MG tablet Commonly known as:  PERIACTIN Take 4 mg by mouth at bedtime.   desonide 0.05 % cream Commonly known as:  DESOWEN apply to affected area once daily if needed What changed:  See the new instructions.   ENBREL MINI 50 MG/ML Soct Generic drug:  Etanercept Inject 50 mg into the skin every Monday.   EPINEPHrine 0.3 mg/0.3 mL Soaj injection Commonly known as:  EPIPEN 2-PAK Use as directed for life-threatening allergic reaction. What changed:  how much to take  how to take this  when to take this  reasons to take this  additional instructions   ezetimibe 10 MG tablet Commonly known as:  ZETIA TAKE 1 TABLET DAILY What changed:  See the new instructions.   fluticasone furoate-vilanterol 200-25 MCG/INH Aepb Commonly known as:  BREO ELLIPTA Inhale 1 puff into the lungs daily. Rinse, gargle, and spit after use.   ibuprofen 200 MG tablet Commonly known as:  ADVIL,MOTRIN Take 600 mg by mouth every 6 (six) hours as needed for fever (alternating with Tylenol). Reported on 01/15/2015   KYLEENA 19.5 MG Iud Generic drug:  Levonorgestrel by Intrauterine route once. Implanted January 2018   levocetirizine 5 MG tablet Commonly known as:  XYZAL Take one tablet by mouth  every evening if needed. What changed:  how much to take  how to take this  when to take this  additional instructions   metFORMIN 1000 MG tablet Commonly known as:  GLUCOPHAGE TAKE 1 TABLET TWICE A DAY WITH A MEAL What changed:  See the new instructions.   multivitamin with minerals Tabs tablet Take 1 tablet by mouth daily.   NOVOFINE 32G X 6 MM Misc Generic drug:  Insulin Pen Needle USE 1 NEEDLE DAILY   Olopatadine HCl 0.2 % Soln Commonly known as:  PATADAY Use one drop in each eye once daily as needed. What changed:  how much to take  how to take this  when to take this  reasons to take this  additional instructions   pantoprazole 40 MG tablet Commonly known as:  PROTONIX Take 1 tablet (40 mg total) by mouth 2 (two) times daily.   predniSONE 20 MG tablet Commonly known as:  DELTASONE Take 1 tablet (20 mg total) by mouth daily with breakfast. What changed:  medication strength  how much to take  when to take this  additional instructions   promethazine 25 MG tablet Commonly known as:  PHENERGAN Take 1 tablet (25 mg total) by mouth every 6 (six) hours as needed  for nausea or vomiting.   SYNTHROID 125 MCG tablet Generic drug:  levothyroxine TAKE 1 TABLET DAILY What changed:  See the new instructions.   Vitamin D (Ergocalciferol) 50000 units Caps capsule Commonly known as:  DRISDOL take 1 capsule by mouth ONCE A WEEK What changed:  See the new instructions.      Follow-up Information    Zenovia Jordan, MD. Schedule an appointment as soon as possible for a visit in 1 week(s).   Specialty:  Rheumatology Contact information: [address] [place] [address] [phone]          Allergies  Allergen Reactions  . Corn-Containing Products Itching  . Peanut-Containing Drug Products Itching  . Soy Allergy Itching  . Amoxicillin Nausea And Vomiting and Rash  . Other Itching and Other (See Comments)    Also, tree  nuts, cats, and dogs (pet dander) - per allergy test      Procedures/Studies: Dg Chest 2 View  Result Date: 07/18/2016 CLINICAL DATA:  21 year old female with cough and cold symptoms. EXAM: CHEST  2 VIEW COMPARISON:  Chest radiograph dated 11/13/2014 FINDINGS: The heart size and mediastinal contours are within normal limits. Both lungs are clear. The visualized skeletal structures are unremarkable. IMPRESSION: No active cardiopulmonary disease. Electronically Signed   By: Elgie Collard M.D.   On: 07/18/2016 20:40   Ct Abdomen Pelvis W Contrast  Result Date: 07/11/2016 CLINICAL DATA:  Right lower quadrant abdominal pain for 4 days with nausea. EXAM: CT ABDOMEN AND PELVIS WITH CONTRAST TECHNIQUE: Multidetector CT imaging of the abdomen and pelvis was performed using the standard protocol following bolus administration of intravenous contrast. CONTRAST:  ISOVUE-300 IOPAMIDOL (ISOVUE-300) INJECTION 61% COMPARISON:  07/06/2006 CT abdomen/pelvis. FINDINGS: Lower chest: No significant pulmonary nodules or acute consolidative airspace disease. Hepatobiliary: Normal liver with no liver mass. Normal gallbladder with no radiopaque cholelithiasis. No biliary ductal dilatation. Pancreas: Normal, with no mass or duct dilation. Spleen: Normal size. No mass. Adrenals/Urinary Tract: Normal adrenals. Normal size kidneys with symmetric contrast nephrograms. Subcentimeter hypodense renal cortical lesion in the interpolar right kidney, too small to characterize, for which no further follow-up is required. No additional renal lesions. No hydronephrosis. Normal bladder. Stomach/Bowel: Grossly normal stomach. Normal caliber small bowel with no small bowel wall thickening. Normal appendix. Normal large bowel with no diverticulosis, large bowel wall thickening or pericolonic fat stranding. Vascular/Lymphatic: Normal caliber abdominal aorta. Patent portal, splenic, hepatic and renal veins. No pathologically enlarged lymph  nodes in the abdomen or pelvis. Reproductive: Grossly normal anteverted uterus with IUD grossly normal in position within the uterine cavity. Simple appearing 1.9 cm right adnexal cyst. No left adnexal lesions. Other: No pneumoperitoneum, ascites or focal fluid collection. Musculoskeletal: No aggressive appearing focal osseous lesions. IMPRESSION: 1. No evidence of bowel obstruction or acute bowel inflammation. Normal appendix . 2. Right adnexal 1.9 cm cyst, for which no further evaluation is required (unless as otherwise clinically warranted). This recommendation follows ACR consensus guidelines: White Paper of the ACR Incidental Findings Committee II on Adnexal Findings. J Am Coll Radiol [phone]. Electronically Signed   By: Delbert Phenix M.D.   On: 07/11/2016 17:17    (Echo, Carotid, EGD, Colonoscopy, ERCP)    Subjective:   Discharge Exam: Vitals:   07/19/16 2008 07/20/16 0409  BP: 129/77 126/65  Pulse: 92 71  Resp: 18 18  Temp: 98.4 F (36.9 C) 98.4 F (36.9 C)   Vitals:   07/19/16 1542 07/19/16 2008 07/20/16 7846  07/20/16 0831  BP:  129/77 126/65   Pulse:  92 71   Resp:  18 18   Temp:  98.4 F (36.9 C) 98.4 F (36.9 C)   TempSrc:  Oral Oral   SpO2: 98% 98% 98% 97%  Weight:      Height:        General: Young obese female not in distress HEENT: Moist mucosa, flushed face, supple neck Cardiovascular: RRR, S1/S2 +, no rubs, no gallops Chest: Clear bilaterally, no added sounds GI: Soft, nondistended, nontender  Musculoskeletal: Erythematous rash over her thighs and legs have markedly improved with remnant macular spots only, nontender and no warmth     The results of significant diagnostics from this hospitalization (including imaging, microbiology, ancillary and laboratory) are listed below for reference.     Microbiology: Recent Results (from the past 240 hour(s))  Urine culture     Status: None   Collection Time: 07/18/16  6:55 PM  Result Value Ref Range  Status   Specimen Description URINE, CLEAN CATCH  Final   Special Requests NONE  Final   Culture NO GROWTH  Final   Report Status 07/19/2016 FINAL  Final  Culture, blood (routine x 2)     Status: None (Preliminary result)   Collection Time: 07/18/16  7:52 PM  Result Value Ref Range Status   Specimen Description BLOOD RIGHT ANTECUBITAL  Final   Special Requests   Final    BOTTLES DRAWN AEROBIC AND ANAEROBIC Blood Culture adequate volume   Culture NO GROWTH 2 DAYS  Final   Report Status PENDING  Incomplete  Culture, blood (routine x 2)     Status: None (Preliminary result)   Collection Time: 07/19/16 12:23 AM  Result Value Ref Range Status   Specimen Description BLOOD LEFT ARM  Final   Special Requests   Final    BOTTLES DRAWN AEROBIC ONLY Blood Culture adequate volume   Culture NO GROWTH 1 DAY  Final   Report Status PENDING  Incomplete  Respiratory Panel by PCR     Status: Abnormal   Collection Time: 07/19/16  1:11 AM  Result Value Ref Range Status   Adenovirus NOT DETECTED NOT DETECTED Final   Coronavirus 229E NOT DETECTED NOT DETECTED Final   Coronavirus HKU1 NOT DETECTED NOT DETECTED Final   Coronavirus NL63 NOT DETECTED NOT DETECTED Final   Coronavirus OC43 NOT DETECTED NOT DETECTED Final   Metapneumovirus NOT DETECTED NOT DETECTED Final   Rhinovirus / Enterovirus DETECTED (A) NOT DETECTED Final   Influenza A NOT DETECTED NOT DETECTED Final   Influenza B NOT DETECTED NOT DETECTED Final   Parainfluenza Virus 1 NOT DETECTED NOT DETECTED Final   Parainfluenza Virus 2 NOT DETECTED NOT DETECTED Final   Parainfluenza Virus 3 NOT DETECTED NOT DETECTED Final   Parainfluenza Virus 4 NOT DETECTED NOT DETECTED Final   Respiratory Syncytial Virus NOT DETECTED NOT DETECTED Final   Bordetella pertussis NOT DETECTED NOT DETECTED Final   Chlamydophila pneumoniae NOT DETECTED NOT DETECTED Final   Mycoplasma pneumoniae NOT DETECTED NOT DETECTED Final     Labs: BNP (last 3 results) No  results for input(s): BNP in the last 8760 hours. Basic Metabolic Panel:  Recent Labs Lab 07/18/16 1707 07/19/16 0023  NA 135 137  K 4.2 4.1  CL 104 104  CO2 21* 23  GLUCOSE 116* 155*  BUN 10 10  CREATININE 0.65 0.69  CALCIUM 8.7* 8.6*   Liver Function Tests:  Recent Labs Lab 07/18/16 1707  AST 25  ALT 37  ALKPHOS 32*  BILITOT 0.4  PROT 6.7  ALBUMIN 3.2*   No results for input(s): LIPASE, AMYLASE in the last 168 hours. No results for input(s): AMMONIA in the last 168 hours. CBC:  Recent Labs Lab 07/18/16 1707 07/19/16 0023  WBC 19.2* 16.4*  NEUTROABS 17.1* 15.2*  HGB 10.7* 9.6*  HCT 34.9* 31.8*  MCV 75.7* 76.4*  PLT 425* 390   Cardiac Enzymes: No results for input(s): CKTOTAL, CKMB, CKMBINDEX, TROPONINI in the last 168 hours. BNP: Invalid input(s): POCBNP CBG:  Recent Labs Lab 07/19/16 2006 07/20/16 0001 07/20/16 0412 07/20/16 0805  GLUCAP 173* 188* 161* 144*   D-Dimer No results for input(s): DDIMER in the last 72 hours. Hgb A1c No results for input(s): HGBA1C in the last 72 hours. Lipid Profile No results for input(s): CHOL, HDL, LDLCALC, TRIG, CHOLHDL, LDLDIRECT in the last 72 hours. Thyroid function studies No results for input(s): TSH, T4TOTAL, T3FREE, THYROIDAB in the last 72 hours.  Invalid input(s): FREET3 Anemia work up No results for input(s): VITAMINB12, FOLATE, FERRITIN, TIBC, IRON, RETICCTPCT in the last 72 hours. Urinalysis    Component Value Date/Time   COLORURINE YELLOW 07/18/2016 1855   APPEARANCEUR HAZY (A) 07/18/2016 1855   LABSPEC 1.027 07/18/2016 1855   PHURINE 6.0 07/18/2016 1855   GLUCOSEU NEGATIVE 07/18/2016 1855   HGBUR NEGATIVE 07/18/2016 1855   BILIRUBINUR NEGATIVE 07/18/2016 1855   BILIRUBINUR neg 01/14/2016 1559   KETONESUR NEGATIVE 07/18/2016 1855   PROTEINUR 30 (A) 07/18/2016 1855   UROBILINOGEN negative 01/14/2016 1559   UROBILINOGEN 1.0 11/13/2014 1601   NITRITE NEGATIVE 07/18/2016 1855    LEUKOCYTESUR TRACE (A) 07/18/2016 1855   Sepsis Labs Invalid input(s): PROCALCITONIN,  WBC,  LACTICIDVEN Microbiology Recent Results (from the past 240 hour(s))  Urine culture     Status: None   Collection Time: 07/18/16  6:55 PM  Result Value Ref Range Status   Specimen Description URINE, CLEAN CATCH  Final   Special Requests NONE  Final   Culture NO GROWTH  Final   Report Status 07/19/2016 FINAL  Final  Culture, blood (routine x 2)     Status: None (Preliminary result)   Collection Time: 07/18/16  7:52 PM  Result Value Ref Range Status   Specimen Description BLOOD RIGHT ANTECUBITAL  Final   Special Requests   Final    BOTTLES DRAWN AEROBIC AND ANAEROBIC Blood Culture adequate volume   Culture NO GROWTH 2 DAYS  Final   Report Status PENDING  Incomplete  Culture, blood (routine x 2)     Status: None (Preliminary result)   Collection Time: 07/19/16 12:23 AM  Result Value Ref Range Status   Specimen Description BLOOD LEFT ARM  Final   Special Requests   Final    BOTTLES DRAWN AEROBIC ONLY Blood Culture adequate volume   Culture NO GROWTH 1 DAY  Final   Report Status PENDING  Incomplete  Respiratory Panel by PCR     Status: Abnormal   Collection Time: 07/19/16  1:11 AM  Result Value Ref Range Status   Adenovirus NOT DETECTED NOT DETECTED Final   Coronavirus 229E NOT DETECTED NOT DETECTED Final   Coronavirus HKU1 NOT DETECTED NOT DETECTED Final   Coronavirus NL63 NOT DETECTED NOT DETECTED Final   Coronavirus OC43 NOT DETECTED NOT DETECTED Final   Metapneumovirus NOT DETECTED NOT DETECTED Final   Rhinovirus / Enterovirus DETECTED (A) NOT DETECTED Final   Influenza A NOT DETECTED NOT DETECTED Final  Influenza B NOT DETECTED NOT DETECTED Final   Parainfluenza Virus 1 NOT DETECTED NOT DETECTED Final   Parainfluenza Virus 2 NOT DETECTED NOT DETECTED Final   Parainfluenza Virus 3 NOT DETECTED NOT DETECTED Final   Parainfluenza Virus 4 NOT DETECTED NOT DETECTED Final   Respiratory  Syncytial Virus NOT DETECTED NOT DETECTED Final   Bordetella pertussis NOT DETECTED NOT DETECTED Final   Chlamydophila pneumoniae NOT DETECTED NOT DETECTED Final   Mycoplasma pneumoniae NOT DETECTED NOT DETECTED Final     Time coordinating discharge: Over 30 minutes  SIGNED:   Eddie North, MD  [facility] 07/20/2016, 11:31 AM Pager   If 7PM-7AM, please contact night-coverage [email or web address] Password [email or web address]

## 2016-07-20 NOTE — Progress Notes (Signed)
Valerie KanskySamantha M Marquez to be D/C'd  per MD order. Discussed with the patient and all questions fully answered.  VSS, Skin clean, dry and intact without evidence of skin break down, no evidence of skin tears noted.  IV catheter discontinued intact. Site without signs and symptoms of complications. Dressing and pressure applied.  An After Visit Summary was printed and given to the patient. Patient received prescription.  D/c education completed with patient/family including follow up instructions, medication list, d/c activities limitations if indicated, with other d/c instructions as indicated by MD - patient able to verbalize understanding, all questions fully answered.   Patient instructed to return to ED, call 911, or call MD for any changes in condition.   Patient to be escorted via WC, and D/C home via private auto.

## 2016-07-23 LAB — CULTURE, BLOOD (ROUTINE X 2)
Culture: NO GROWTH
Special Requests: ADEQUATE

## 2016-07-24 LAB — CULTURE, BLOOD (ROUTINE X 2)
Culture: NO GROWTH
SPECIAL REQUESTS: ADEQUATE

## 2016-08-14 ENCOUNTER — Other Ambulatory Visit: Payer: Self-pay | Admitting: Allergy and Immunology

## 2016-08-14 DIAGNOSIS — R1013 Epigastric pain: Secondary | ICD-10-CM

## 2016-08-24 ENCOUNTER — Telehealth: Payer: Self-pay | Admitting: Obstetrics & Gynecology

## 2016-08-24 NOTE — Telephone Encounter (Signed)
LMTCB/:NP/ .CX/LETTER SENT/RD ° °

## 2016-08-31 ENCOUNTER — Ambulatory Visit (INDEPENDENT_AMBULATORY_CARE_PROVIDER_SITE_OTHER): Payer: 59 | Admitting: Obstetrics & Gynecology

## 2016-08-31 ENCOUNTER — Encounter: Payer: Self-pay | Admitting: Obstetrics & Gynecology

## 2016-08-31 VITALS — BP 144/80 | HR 106 | Resp 18 | Ht 67.0 in | Wt 247.0 lb

## 2016-08-31 DIAGNOSIS — Z975 Presence of (intrauterine) contraceptive device: Secondary | ICD-10-CM | POA: Diagnosis not present
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
## 2016-09-21 ENCOUNTER — Other Ambulatory Visit: Payer: Self-pay

## 2016-09-21 MED ORDER — ALBUTEROL SULFATE HFA 108 (90 BASE) MCG/ACT IN AERS: INHALATION_SPRAY | RESPIRATORY_TRACT | 0 refills | Status: DC

## 2016-09-21 MED ORDER — LEVOCETIRIZINE DIHYDROCHLORIDE 5 MG PO TABS: ORAL_TABLET | ORAL | 2 refills | Status: DC

## 2016-09-21 MED ORDER — FLUTICASONE FUROATE-VILANTEROL 200-25 MCG/INH IN AEPB: 1.0000 | INHALATION_SPRAY | Freq: Every day | RESPIRATORY_TRACT | 2 refills | Status: DC

## 2016-09-21 NOTE — Telephone Encounter (Signed)
Spoke to mother sent in refill on proair hfa, Breo 200, and xyzal 5mg  [facility]

## 2016-09-21 NOTE — Telephone Encounter (Signed)
Mom called for her daughter Valerie Marquez. She is needing her Xyzal refilled and also her inhaler. [facility] day supply.  Thanks   Last Seen 06/10/2016 Kozlow

## 2016-12-16 ENCOUNTER — Telehealth: Payer: Self-pay | Admitting: Allergy and Immunology

## 2016-12-16 MED ORDER — ALBUTEROL SULFATE HFA 108 (90 BASE) MCG/ACT IN AERS: INHALATION_SPRAY | RESPIRATORY_TRACT | 0 refills | Status: DC

## 2016-12-16 MED ORDER — FLUTICASONE FUROATE-VILANTEROL 200-25 MCG/INH IN AEPB: 1.0000 | INHALATION_SPRAY | Freq: Every day | RESPIRATORY_TRACT | 0 refills | Status: DC

## 2016-12-16 NOTE — Telephone Encounter (Signed)
Prescriptions have been sent. I called patient and advised her that these would be the last refills she would get until seen in office. She did schedule a visit for 12-22-16 with Dr. Lucie LeatherKozlow

## 2016-12-16 NOTE — Telephone Encounter (Signed)
Pt mom called and said that she needed to have Breo called into [facility] on [address] now because she is almost out and needs to have the other  [facility] [phone].

## 2016-12-21 ENCOUNTER — Telehealth: Payer: Self-pay | Admitting: Obstetrics & Gynecology

## 2016-12-21 NOTE — Telephone Encounter (Signed)
Spoke with patient. Patient states that she has a Rheumatologic problem that has been flaring up since her IUD insertion. States that she has had a few UTI's since insertion and more cramping with menses. Denies any pain or bleeding at this time. States her menses is normal, denies heavy bleeding. Advised will need to be seen for further evaluation. Patient is away at college and cannot be seen until 12-7. Appointment scheduled for 12-7 at 10 am with Dr.Miller. Patient is agreeable to date and time. Advised if symptoms reoccur will need to be seen locally where she is at for school. Patient is agreeable.  Routing to provider for final review. Patient agreeable to disposition. Will close encounter.

## 2016-12-21 NOTE — Telephone Encounter (Signed)
Patient thinks she may be having issues with her IUD.  States she is having severe cramping and also has had several UTIs in the past .

## 2016-12-22 ENCOUNTER — Ambulatory Visit (INDEPENDENT_AMBULATORY_CARE_PROVIDER_SITE_OTHER): Payer: 59 | Admitting: Allergy and Immunology

## 2016-12-22 ENCOUNTER — Encounter: Payer: Self-pay | Admitting: Allergy and Immunology

## 2016-12-22 VITALS — BP 140/84 | HR 92 | Resp 20

## 2016-12-22 DIAGNOSIS — J4540 Moderate persistent asthma, uncomplicated: Secondary | ICD-10-CM

## 2016-12-22 DIAGNOSIS — J3089 Other allergic rhinitis: Secondary | ICD-10-CM

## 2016-12-22 DIAGNOSIS — L52 Erythema nodosum: Secondary | ICD-10-CM | POA: Diagnosis not present

## 2016-12-22 DIAGNOSIS — R1013 Epigastric pain: Secondary | ICD-10-CM | POA: Diagnosis not present

## 2016-12-22 DIAGNOSIS — L2089 Other atopic dermatitis: Secondary | ICD-10-CM | POA: Diagnosis not present

## 2016-12-22 MED ORDER — ALBUTEROL SULFATE HFA 108 (90 BASE) MCG/ACT IN AERS: INHALATION_SPRAY | RESPIRATORY_TRACT | 1 refills | Status: AC

## 2016-12-22 MED ORDER — EPINEPHRINE 0.3 MG/0.3ML IJ SOAJ: INTRAMUSCULAR | 2 refills | Status: AC

## 2016-12-22 MED ORDER — FLUTICASONE FUROATE-VILANTEROL 200-25 MCG/INH IN AEPB: 1.0000 | INHALATION_SPRAY | Freq: Every day | RESPIRATORY_TRACT | 2 refills | Status: DC

## 2016-12-22 MED ORDER — LEVOCETIRIZINE DIHYDROCHLORIDE 5 MG PO TABS: ORAL_TABLET | ORAL | 2 refills | Status: AC

## 2016-12-22 MED ORDER — PANTOPRAZOLE SODIUM 40 MG PO TBEC: 40.0000 mg | DELAYED_RELEASE_TABLET | Freq: Two times a day (BID) | ORAL | 2 refills | Status: AC

## 2016-12-22 NOTE — Progress Notes (Signed)
Follow-up Note  Referring Provider: Aggie HackerSumner, Brian, MD Primary Provider: Aggie HackerSumner, Brian, MD Date of Office Visit: 12/22/2016  Subjective:   Valerie Marquez (DOB: [birth year]) is a 21 y.o. female [facility] in re-evaluation of the following:  HPI: [name] returns to this clinic in reevaluation of her asthma and allergic rhinoconjunctivitis and LPR and history of atopic dermatitis and history of erythema nodosum.  Her last visit to this clinic was 10 Jun 2016.  Overall her asthma has been under excellent control and she has not required a systemic steroid to treat an exacerbation of her asthma while consistently using her combination controller inhaler.  However, it should be noted that she is used systemic steroids to treat her erythema nodosum and part of her control of asthma may be related to the steroid use.  Rarely does she use a short acting bronchodilator and she can exercise without any difficulty.  She has not really been having any problems with her nose and her atopic dermatitis has been doing quite well and her eyes have been doing well.  She has not required an antibiotic to treat an episode of sinusitis.  Unfortunately her erythema nodosum is flaring even in the face of using dapsone and Plaquenil and systemic steroids.  She recently ended up in the urgent care center for prednisone at a dose of 40 mg a day for a flareup and she is scheduled to see her rheumatologist this week.  She is already failed Humira and Enbrel.  Allergies as of 12/22/2016      Reactions   Corn-containing Products Itching   Peanut-containing Drug Products Itching   Soy Allergy Itching   Amoxicillin Nausea And Vomiting, Rash   Other Itching, Other (See Comments)   Also, tree nuts, cats, and dogs (pet dander) - per allergy test      Medication List      acetaminophen 500 MG tablet Commonly known as:  TYLENOL Take 1,000 mg by mouth every 4 (four)  hours as needed for fever (alternating with Ibuprofen). Reported on 01/15/2015   albuterol 108 (90 Base) MCG/ACT inhaler Commonly known as:  PROAIR HFA Inhale two puffs every four to six hours as needed for cough or wheeze.   albuterol (2.5 MG/3ML) 0.083% nebulizer solution Commonly known as:  PROVENTIL Take 2.5 mg by nebulization every 6 (six) hours as needed for wheezing. Reported on 01/15/2015   amphetamine-dextroamphetamine 20 MG 24 hr capsule Commonly known as:  ADDERALL XR Take 20 mg by mouth daily.   B-complex with vitamin C tablet Take 1 tablet by mouth daily.   CALCIUM 500/D 500-200 MG-UNIT tablet Generic drug:  calcium-vitamin D Take 1 tablet by mouth daily.   cyproheptadine 4 MG tablet Commonly known as:  PERIACTIN Take 4 mg by mouth at bedtime.   dapsone 100 MG tablet Take 50 mg by mouth daily.   desonide 0.05 % cream Commonly known as:  DESOWEN apply to affected area once daily if needed   EPINEPHrine 0.3 mg/0.3 mL Soaj injection Commonly known as:  EPIPEN 2-PAK Use as directed for life-threatening allergic reaction.   ezetimibe 10 MG tablet Commonly known as:  ZETIA TAKE 1 TABLET DAILY   fluticasone furoate-vilanterol 200-25 MCG/INH Aepb Commonly known as:  BREO ELLIPTA Inhale 1 puff into the lungs daily. Rinse, gargle, and spit after use.   ibuprofen 200 MG tablet Commonly known as:  ADVIL,MOTRIN Take 600 mg by mouth every 6 (  six) hours as needed for fever (alternating with Tylenol). Reported on 01/15/2015   KYLEENA 19.5 MG Iud Generic drug:  Levonorgestrel by Intrauterine route once. Implanted January 2018   levocetirizine 5 MG tablet Commonly known as:  XYZAL Take one tablet by mouth every evening if needed.   metFORMIN 1000 MG tablet Commonly known as:  GLUCOPHAGE TAKE 1 TABLET TWICE A DAY WITH A MEAL   multivitamin with minerals Tabs tablet Take 1 tablet by mouth daily.   NOVOFINE 32G X 6 MM Misc Generic drug:  Insulin Pen Needle USE  1 NEEDLE DAILY   Olopatadine HCl 0.2 % Soln Commonly known as:  PATADAY Use one drop in each eye once daily as needed.   pantoprazole 40 MG tablet Commonly known as:  PROTONIX Take 1 tablet (40 mg total) by mouth 2 (two) times daily.   RA LAXATIVE 5 MG EC tablet Generic drug:  bisacodyl See admin instructions.   SYNTHROID 125 MCG tablet Generic drug:  levothyroxine TAKE 1 TABLET DAILY   traMADol 50 MG tablet Commonly known as:  ULTRAM take 1 tablet by mouth if needed UP TO TWICE A DAY FOR 10 DAYS   Vitamin D (Ergocalciferol) 50000 units Caps capsule Commonly known as:  DRISDOL take 1 capsule by mouth ONCE A WEEK       Past Medical History:  Diagnosis Date  . Abnormal liver function tests   . Abnormal thyroid function test   . Acanthosis   . Allergic rhinitis   . Asthma   . Combined hyperlipidemia   . Concussion   . Diabetes mellitus (HCC)   . Diabetes mellitus without complication (HCC)   . Dyspepsia   . Eczema   . Erythema nodosum   . Gastroesophageal reflux   . Goiter    hypothyroidsm--hashimoto's  . Hypertension   . Iron deficiency anemia   . Lactose intolerance   . Obesity   . Prediabetes   . Psoriatic arthritis [facility])     Past Surgical History:  Procedure Laterality Date  . ANKLE SURGERY Right    --bone spur  . KNEE SURGERY Left 2014   repair of ACL  . sinuses    . TONSILLECTOMY AND ADENOIDECTOMY    . TYMPANOSTOMY TUBE PLACEMENT    . WISDOM TOOTH EXTRACTION      Review of systems negative except as noted in HPI / PMHx or noted below:  Review of Systems  Constitutional: Negative.   HENT: Negative.   Eyes: Negative.   Respiratory: Negative.   Cardiovascular: Negative.   Gastrointestinal: Negative.   Genitourinary: Negative.   Musculoskeletal: Negative.   Skin: Negative.   Neurological: Negative.   Endo/Heme/Allergies: Negative.   Psychiatric/Behavioral: Negative.      Objective:   Vitals:   12/22/16 1720  BP: 140/84  Pulse: 92   Resp: 20          Physical Exam  Constitutional: She is well-developed, well-nourished, and in no distress.  HENT:  Head: Normocephalic.  Right Ear: External ear and ear canal normal. Tympanic membrane is scarred.  Left Ear: External ear and ear canal normal. Tympanic membrane is scarred.  Nose: Nose normal. No mucosal edema or rhinorrhea.  Mouth/Throat: Uvula is midline, oropharynx is clear and moist and mucous membranes are normal. No oropharyngeal exudate.  Eyes: Conjunctivae are normal.  Neck: Trachea normal. No tracheal tenderness present. No tracheal deviation present. No thyromegaly present.  Cardiovascular: Normal rate, regular rhythm, S1 normal, S2 normal and normal heart sounds.  No murmur heard. Pulmonary/Chest: Breath sounds normal. No stridor. No respiratory distress. She has no wheezes. She has no rales.  Musculoskeletal: She exhibits no edema.  Lymphadenopathy:       Head (right side): No tonsillar adenopathy present.       Head (left side): No tonsillar adenopathy present.    She has no cervical adenopathy.  Neurological: She is alert. Gait normal.  Skin: No rash (Multiple erythematous warm indurated nodules affecting lower extremities) noted. She is not diaphoretic. No erythema. Nails show no clubbing.  Psychiatric: Mood and affect normal.    Diagnostics:    Spirometry was performed and demonstrated an FEV1 of 3.85 at 105 % of predicted.  The patient had an Asthma Control Test with the following results: ACT Total Score: 20.    Assessment and Plan:   1. Asthma, moderate persistent, well-controlled   2. Other allergic rhinitis   3. Other atopic dermatitis   4. Erythema nodosum   5. Dyspepsia     1. Continue Breo 200 one inhalation 1 time per day.    2. Continue ProAir HFA 2 puffs or albuterol nebulization every 4-6 hours if needed  3. Continue Protonix 40 two times per day  4. Continue antihistamine / levocetirizine 5 mg and Optivar if needed  5.  Return to clinic in 12 months or earlier if problem  6.  Discuss with Dr. Nickola MajorHawkes about using rituximab for E. nodosum  [name] is doing well from a respiratory standpoint and her reflux is under good control but her erythema nodosum is out of control.  She will continue to use anti-inflammatory agents for her respiratory tract and therapy directed against reflux as noted above and she will follow-up concerning her erythema nodosum with her rheumatologist.  She has failed multiple biological agents and she may benefit from a course of rituximab.  If she does well I will see her back in this clinic in 1 year or earlier if there is a problem.  Laurette SchimkeEric Termaine Roupp, MD Allergy / Immunology  [facility]

## 2016-12-22 NOTE — Patient Instructions (Addendum)
  1. Continue Breo 200 one inhalation 1 time per day.    2. Continue ProAir HFA 2 puffs or albuterol nebulization every 4-6 hours if needed  3. Continue Protonix 40 two times per day  4. Continue antihistamine / levocetirizine 5 mg and Optivar if needed  5. Return to clinic in 12 months or earlier if problem  6.  Discuss with Dr. Nickola MajorHawkes about using rituximab for E. nodosum

## 2016-12-23 ENCOUNTER — Other Ambulatory Visit: Payer: Self-pay | Admitting: Allergy and Immunology

## 2016-12-23 ENCOUNTER — Encounter: Payer: Self-pay | Admitting: Allergy and Immunology

## 2017-01-01 ENCOUNTER — Encounter: Payer: Self-pay | Admitting: Obstetrics & Gynecology

## 2017-01-01 ENCOUNTER — Ambulatory Visit (INDEPENDENT_AMBULATORY_CARE_PROVIDER_SITE_OTHER): Payer: 59 | Admitting: Obstetrics & Gynecology

## 2017-01-01 ENCOUNTER — Other Ambulatory Visit: Payer: Self-pay

## 2017-01-01 VITALS — BP 116/60 | HR 88 | Resp 16 | Wt 242.0 lb

## 2017-01-01 DIAGNOSIS — Z30432 Encounter for removal of intrauterine contraceptive device: Secondary | ICD-10-CM | POA: Diagnosis not present

## 2017-01-01 DIAGNOSIS — L52 Erythema nodosum: Secondary | ICD-10-CM | POA: Diagnosis not present

## 2017-01-01 MED ORDER — CLOBETASOL PROPIONATE 0.05 % EX OINT: 1.0000 "application " | TOPICAL_OINTMENT | Freq: Two times a day (BID) | CUTANEOUS | 1 refills | Status: AC

## 2017-01-01 NOTE — Progress Notes (Signed)
GYNECOLOGY  VISIT  CC:   IUD check  HPI: 21 y.o. G0P0000 Single Caucasian female here for discussion of IUD and possible relation to erythema nodosum.  Having more issues with flares related to this.  Has seen dermatology at [facility] and seeing rheumatology as well.  Has been on several biologics that have not helped.  Was using IUD for irregular and, at times, heavy bleeding.  Does not need IUD for contracpetion at this time.  D/w pt that I have seen one other patient with erythema nodosum that was related to nexplanon use and that it resolved with removal.  However, that is a higher progesterone dosage and she may not have any change with IUD removal.  Still she is desirous so will remove today.  GYNECOLOGIC HISTORY: Patient's last menstrual period was 12/24/2016. Contraception: not SA  Patient Active Problem List   Diagnosis Date Noted  . Sepsis affecting skin (HCC) 07/20/2016  . Erythema nodosum, chronic form   . Fever   . Erythema nodosum, acute form 07/18/2016  . Loose stools 07/18/2016  . Precordial chest pain 11/13/2014  . Erythema nodosum   . Microscopic hematuria   . Vitamin D deficiency 05/24/2014  . Eczema of both hands 05/24/2014  . Menorrhagia 11/10/2012  . RUQ abdominal pain 11/09/2012  . Abnormal findings on diagnostic imaging of gall bladder 11/09/2012  . Type 2 diabetes mellitus (HCC) 04/07/2012  . Hypothyroid 01/04/2012  . Acanthosis   . Goiter   . Abnormal thyroid function test   . Combined hyperlipidemia   . Dyspepsia   . Hypertension   . Abnormal transaminases   . Mild persistent asthma   . Gastroesophageal reflux   . Obesity 06/30/2010  . Mixed hyperlipidemia 06/30/2010    Past Medical History:  Diagnosis Date  . Abnormal liver function tests   . Abnormal thyroid function test   . Acanthosis   . Allergic rhinitis   . Asthma   . Combined hyperlipidemia   . Concussion   . Diabetes mellitus (HCC)   . Diabetes mellitus without complication (HCC)   .  Dyspepsia   . Eczema   . Erythema nodosum   . Gastroesophageal reflux   . Goiter    hypothyroidsm--hashimoto's  . Hypertension   . Iron deficiency anemia   . Lactose intolerance   . Obesity   . Prediabetes   . Psoriatic arthritis [facility])     Past Surgical History:  Procedure Laterality Date  . ANKLE SURGERY Right    --bone spur  . KNEE SURGERY Left 2014   repair of ACL  . sinuses    . TONSILLECTOMY AND ADENOIDECTOMY    . TYMPANOSTOMY TUBE PLACEMENT    . WISDOM TOOTH EXTRACTION      MEDS:   Current Outpatient Medications on File Prior to Visit  Medication Sig Dispense Refill  . acetaminophen (TYLENOL) 500 MG tablet Take 1,000 mg by mouth every 4 (four) hours as needed for fever (alternating with Ibuprofen). Reported on 01/15/2015    . albuterol (PROAIR HFA) 108 (90 Base) MCG/ACT inhaler Inhale two puffs every four to six hours as needed for cough or wheeze. 3 Inhaler 1  . albuterol (PROVENTIL) (2.5 MG/3ML) 0.083% nebulizer solution Take 2.5 mg by nebulization every 6 (six) hours as needed for wheezing. Reported on 01/15/2015    . amphetamine-dextroamphetamine (ADDERALL XR) 20 MG 24 hr capsule Take 20 mg by mouth daily.  0  . B Complex-C (B-COMPLEX WITH VITAMIN C) tablet Take 1 tablet  by mouth daily.     Marland Kitchen. BREO ELLIPTA 200-25 MCG/INH AEPB INHALE ONE PUFF INTO THE LUNGS DAILY, RINSE, GARGLE AND SPIT AFTER USE 180 each 1  . calcium-vitamin D (CALCIUM 500/D) 500-200 MG-UNIT per tablet Take 1 tablet by mouth daily.     . cyproheptadine (PERIACTIN) 4 MG tablet Take 4 mg by mouth at bedtime.     . dapsone 100 MG tablet Take 50 mg by mouth daily.  0  . desonide (DESOWEN) 0.05 % cream apply to affected area once daily if needed (Patient taking differently: Apply to affected areas once a day as needed for itchy skin) 60 g 5  . diclofenac sodium (VOLTAREN) 1 % GEL APP EXT AA QID UTD PRN  6  . EPINEPHrine (EPIPEN 2-PAK) 0.3 mg/0.3 mL IJ SOAJ injection Use as directed for life-threatening  allergic reaction. 2 Device 2  . ezetimibe (ZETIA) 10 MG tablet TAKE 1 TABLET DAILY (Patient taking differently: Take 10 mg by mouth once a day) 90 tablet 2  . ibuprofen (ADVIL,MOTRIN) 200 MG tablet Take 600 mg by mouth every 6 (six) hours as needed for fever (alternating with Tylenol). Reported on 01/15/2015    . levocetirizine (XYZAL) 5 MG tablet Take one tablet by mouth every evening if needed. 90 tablet 2  . Levonorgestrel (KYLEENA) 19.5 MG IUD by Intrauterine route once. Implanted January 2018    . metFORMIN (GLUCOPHAGE) 1000 MG tablet TAKE 1 TABLET TWICE A DAY WITH A MEAL (Patient taking differently: Take 1,000 mg by mouth two times a day with a meal) 180 tablet 6  . Multiple Vitamin (MULTIVITAMIN WITH MINERALS) TABS tablet Take 1 tablet by mouth daily.    . Olopatadine HCl (PATADAY) 0.2 % SOLN Use one drop in each eye once daily as needed. (Patient taking differently: Place 1 drop into both eyes daily as needed (for seasonal allergies). ) 7.5 mL 0  . pantoprazole (PROTONIX) 40 MG tablet Take 1 tablet (40 mg total) by mouth 2 (two) times daily. 180 tablet 2  . predniSONE (DELTASONE) 5 MG tablet TK 1 TO 4 TS PO QD UTD  0  . RA LAXATIVE 5 MG EC tablet See admin instructions.  0  . SYNTHROID 125 MCG tablet TAKE 1 TABLET DAILY (Patient taking differently: Take 125 mcg by mouth once a day) 90 tablet 3  . traMADol (ULTRAM) 50 MG tablet take 1 tablet by mouth if needed UP TO TWICE A DAY FOR 10 DAYS  0  . Vitamin D, Ergocalciferol, (DRISDOL) 50000 units CAPS capsule take 1 capsule by mouth ONCE A WEEK (Patient taking differently: Take 50,000 units by mouth once a week) 12 capsule 0  . inFLIXimab in sodium chloride 0.9 % Inject into the vein.    . [DISCONTINUED] bethanechol (URECHOLINE) 5 MG tablet Take 1 tablet (5 mg total) by mouth 3 (three) times daily. 270 tablet 3   No current facility-administered medications on file prior to visit.     ALLERGIES: Corn-containing products; Peanut-containing  drug products; Soy allergy; Amoxicillin; and Other  Family History  Problem Relation Age of Onset  . Diabetes Mother   . Lupus Mother   . Hyperlipidemia Father   . Hypertension Father   . Diabetes Father   . Colon cancer Father   . Thyroid disease Brother   . Eczema Brother   . Hyperlipidemia Brother   . Diabetes Brother        pre diabetic  . Cholelithiasis Maternal Grandmother   . Diabetes  Maternal Grandmother   . Cervical cancer Maternal Grandmother   . Stroke Maternal Grandmother   . Cancer Maternal Grandfather   . Diabetes Maternal Grandfather   . Stroke Maternal Grandfather   . Diabetes Paternal Grandmother   . Diabetes Paternal Grandfather     SH:  Single, non smoker  Review of Systems  Constitutional: Negative.   Gastrointestinal: Negative.   Genitourinary: Negative.   Psychiatric/Behavioral: Negative.     PHYSICAL EXAMINATION:    BP 116/60 (BP Location: Right Arm, Patient Position: Sitting, Cuff Size: Large)   Pulse 88   Resp 16   Wt 242 lb (109.8 kg)   LMP 12/24/2016   BMI 37.90 kg/m     General appearance: alert, cooperative and appears stated age Inguinal:  No LAD  Pelvic: External genitalia:  no lesions              Urethra:  normal appearing urethra with no masses, tenderness or lesions              Bartholins and Skenes: normal                 Vagina: normal appearing vagina with normal color and discharge, no lesions              Cervix: no lesions and IUD string noted.   Procedure:  IUD string grasped with ringed forceps and removed easily with one pull.  IUD discarded after pt visualized.  Pt tolerated procedure well.               Bimanual Exam:  Uterus:  normal size, contour, position, consistency, mobility, non-tender              Adnexa: no mass, fullness, tenderness   Chaperone was present for exam.  Assessment: IUD use, s/p removal today due to erythema nodosum   Plan: Pt will let me know if cycles change and if she ends up  desiring something for cycle control.

## 2017-01-05 ENCOUNTER — Other Ambulatory Visit: Payer: Self-pay | Admitting: Pediatric Endocrinology

## 2017-01-05 DIAGNOSIS — E782 Mixed hyperlipidemia: Secondary | ICD-10-CM

## 2017-01-15 ENCOUNTER — Ambulatory Visit: Payer: 59 | Admitting: Nurse Practitioner

## 2017-02-05 ENCOUNTER — Ambulatory Visit: Payer: Self-pay | Admitting: Obstetrics & Gynecology

## 2017-04-07 ENCOUNTER — Telehealth: Payer: Self-pay | Admitting: Obstetrics & Gynecology

## 2017-04-07 NOTE — Telephone Encounter (Signed)
Patient would like to discuss having an iud inserted.

## 2017-04-08 NOTE — Telephone Encounter (Signed)
Left message to call Bryce Cheever at [phone]. 

## 2017-04-08 NOTE — Telephone Encounter (Signed)
Spoke with patient. Patient states that she had her Kyleena removed 12/2016 because it was thought to be possibly contributing to her erythema nodosum flare ups. Reports since removal she has had follow up and this was not the cause. Requesting another Rutha BouchardKyleena to be placed as she is having long menstrual cycles. Did not have a menses since 12/2016. Started menses 3 weeks ago and is still bleeding. Was changing her tampon every other hour until a few days ago. Now changing 2-3 times a day. Patient is not sexually active. Desire this for cycle regulation. Advised will review with Dr.Miller to ensure nothing further is needed before reinsertion and will return call.

## 2017-04-08 NOTE — Telephone Encounter (Signed)
Ok to schedule when convenient.  She has never been SA so does not have to be bleeding for IUD insertion.

## 2017-04-09 MED ORDER — MISOPROSTOL 200 MCG PO TABS: ORAL_TABLET | ORAL | 0 refills | Status: DC

## 2017-04-09 NOTE — Telephone Encounter (Signed)
Left message to call Aditri Louischarles at [phone]. 

## 2017-04-09 NOTE — Addendum Note (Signed)
Addended by: Nolen MuSPRAGUE, KAITLYN E on: 04/09/2017 10:30 AM   Modules accepted: Orders

## 2017-04-09 NOTE — Telephone Encounter (Signed)
Spoke with patient. Kyleena insertion scheduled for 04/29/2017 at 4 pm with Dr.Miller. Pre procedure instructions given.  Motrin instructions given. Motrin=Advil=Ibuprofen, 800 mg one hour before appointment. Eat a meal and hydrate well before appointment. Place 1 tablet PV night before the procedure. Place 1 tablet PV the morning of the procedure. Rx for Cytotec 200 mcg #2 0RF sent to pharmacy on file. Patient verbalizes understanding.  Routing to provider for final review. Patient agreeable to disposition. Will close encounter.

## 2017-04-21 ENCOUNTER — Other Ambulatory Visit: Payer: Self-pay

## 2017-04-21 DIAGNOSIS — Z30430 Encounter for insertion of intrauterine contraceptive device: Secondary | ICD-10-CM

## 2017-04-21 DIAGNOSIS — N920 Excessive and frequent menstruation with regular cycle: Secondary | ICD-10-CM

## 2017-04-29 ENCOUNTER — Ambulatory Visit (INDEPENDENT_AMBULATORY_CARE_PROVIDER_SITE_OTHER): Payer: 59 | Admitting: Obstetrics & Gynecology

## 2017-04-29 ENCOUNTER — Encounter: Payer: Self-pay | Admitting: Obstetrics & Gynecology

## 2017-04-29 ENCOUNTER — Telehealth: Payer: Self-pay | Admitting: Obstetrics & Gynecology

## 2017-04-29 ENCOUNTER — Other Ambulatory Visit: Payer: Self-pay

## 2017-04-29 DIAGNOSIS — N920 Excessive and frequent menstruation with regular cycle: Secondary | ICD-10-CM | POA: Diagnosis not present

## 2017-04-29 DIAGNOSIS — Z30430 Encounter for insertion of intrauterine contraceptive device: Secondary | ICD-10-CM

## 2017-04-29 LAB — POCT URINE PREGNANCY: PREG TEST UR: NEGATIVE

## 2017-04-29 NOTE — Telephone Encounter (Signed)
[facility], [place], [place]   Patient went to pick up her prescription for her IUD insertion this afternoon and was told they did not have the prescription.

## 2017-04-29 NOTE — Telephone Encounter (Signed)
Reviewed with Dr. Hyacinth MeekerMiller, call returned to patient. Advised ok to proceed with IUD placement as scheduled, no Cytotec required. Advised to take Motrin 800 mg with food and water one hour before procedure.  Routing to provider for final review. Patient is agreeable to disposition. Will close encounter.

## 2017-04-29 NOTE — Telephone Encounter (Signed)
Spoke with patient, is scheduled for IUD placement today at 4pm with Dr. Hyacinth MeekerMiller. Did not pick up cytotec RX, asking if needed at this point? Advised will review with Dr. Hyacinth MeekerMiller and return call.

## 2017-04-29 NOTE — Telephone Encounter (Signed)
[facility], was advised Rx for cytotec on file, will fill and be ready for pick up today.

## 2017-05-02 NOTE — Progress Notes (Signed)
22 y.o. G0P0000 Single Caucasian female presents for insertion of Kyleena IUD.  Previously had IUD and had removed as was concerned this was related to skin issue.  As it is clear now that IUD use is not related, desires replacement.    Not currently SA but has considered and desires for both cycle control and contraception, when that is needed.    Pt has also been counseled about risks and benefits as well as complications.  Consent is obtained today.  All questions answered prior to start of procedure.    Current contraception: none Last STD testing:  Not indicated LMP:  Patient's last menstrual period was 04/05/2017.  Patient Active Problem List   Diagnosis Date Noted  . Erythema nodosum, chronic form   . Fever   . Erythema nodosum, acute form 07/18/2016  . Concussion 05/08/2015  . Nephrolithiasis 02/18/2015  . Erythema nodosum   . Microscopic hematuria   . Vitamin D deficiency 05/24/2014  . Eczema of both hands 05/24/2014  . Menorrhagia 11/10/2012  . Abnormal findings on diagnostic imaging of gall bladder 11/09/2012  . Arthritis, multiple joint involvement 05/19/2012  . Asthma 05/11/2012  . Hashimoto's disease 05/11/2012  . Type II diabetes mellitus (HCC) 04/07/2012  . Hypothyroid 01/04/2012  . Iron deficiency anemia 01/04/2012  . History of erythema nodosum 03/29/2011  . Acanthosis   . Goiter   . Abnormal thyroid function test   . Combined hyperlipidemia   . Dyspepsia   . Hypertension   . Abnormal transaminases   . Mild persistent asthma   . Gastroesophageal reflux   . Obesity 06/30/2010  . Mixed hyperlipidemia 06/30/2010   Past Medical History:  Diagnosis Date  . Abnormal liver function tests   . Abnormal thyroid function test   . Acanthosis   . Allergic rhinitis   . Asthma   . Combined hyperlipidemia   . Concussion   . Diabetes mellitus (HCC)   . Diabetes mellitus without complication (HCC)   . Dyspepsia   . Eczema   . Erythema nodosum   .  Gastroesophageal reflux   . Goiter    hypothyroidsm--hashimoto's  . Hypertension   . Iron deficiency anemia   . Lactose intolerance   . Obesity   . Prediabetes   . Psoriatic arthritis (HCC)    Current Outpatient Medications on File Prior to Visit  Medication Sig Dispense Refill  . acetaminophen (TYLENOL) 500 MG tablet Take 1,000 mg by mouth every 4 (four) hours as needed for fever (alternating with Ibuprofen). Reported on 01/15/2015    . albuterol (PROAIR HFA) 108 (90 Base) MCG/ACT inhaler Inhale two puffs every four to six hours as needed for cough or wheeze. 3 Inhaler 1  . albuterol (PROVENTIL) (2.5 MG/3ML) 0.083% nebulizer solution Take 2.5 mg by nebulization every 6 (six) hours as needed for wheezing. Reported on 01/15/2015    . amphetamine-dextroamphetamine (ADDERALL XR) 20 MG 24 hr capsule Take 20 mg by mouth daily.  0  . amphetamine-dextroamphetamine (ADDERALL) 10 MG tablet Take 1 tablet by mouth daily.  0  . B Complex-C (B-COMPLEX WITH VITAMIN C) tablet Take 1 tablet by mouth daily.     Marland Kitchen BREO ELLIPTA 200-25 MCG/INH AEPB INHALE ONE PUFF INTO THE LUNGS DAILY, RINSE, GARGLE AND SPIT AFTER USE 180 each 1  . Calcium Carb-Cholecalciferol (CALCIUM-VITAMIN D) 500-200 MG-UNIT tablet Take by mouth daily.    . clobetasol ointment (TEMOVATE) 0.05 % Apply 1 application topically 2 (two) times daily. Apply as directed twice daily  60 g 1  . dapsone 100 MG tablet Take 50 mg by mouth daily.  0  . desonide (DESOWEN) 0.05 % cream apply to affected area once daily if needed (Patient taking differently: Apply to affected areas once a day as needed for itchy skin) 60 g 5  . diclofenac sodium (VOLTAREN) 1 % GEL APP EXT AA QID UTD PRN  6  . EPINEPHrine (EPIPEN 2-PAK) 0.3 mg/0.3 mL IJ SOAJ injection Use as directed for life-threatening allergic reaction. 2 Device 2  . ezetimibe (ZETIA) 10 MG tablet TAKE 1 TABLET DAILY (Patient taking differently: Take 10 mg by mouth once a day) 90 tablet 2  . ibuprofen  (ADVIL,MOTRIN) 200 MG tablet Take 600 mg by mouth every 6 (six) hours as needed for fever (alternating with Tylenol). Reported on 01/15/2015    . ibuprofen (ADVIL,MOTRIN) 800 MG tablet daily as needed.  2  . inFLIXimab in sodium chloride 0.9 % Inject into the vein.    Marland Kitchen. levalbuterol (XOPENEX HFA) 45 MCG/ACT inhaler Inhale into the lungs daily as needed.    Marland Kitchen. levocetirizine (XYZAL) 5 MG tablet Take one tablet by mouth every evening if needed. 90 tablet 2  . metFORMIN (GLUCOPHAGE) 1000 MG tablet TAKE 1 TABLET TWICE A DAY WITH A MEAL (Patient taking differently: Take 1,000 mg by mouth two times a day with a meal) 180 tablet 6  . montelukast (SINGULAIR) 10 MG tablet Take by mouth daily as needed.    . Multiple Vitamin (MULTIVITAMIN WITH MINERALS) TABS tablet Take 1 tablet by mouth daily.    . Olopatadine HCl (PATADAY) 0.2 % SOLN Use one drop in each eye once daily as needed. (Patient taking differently: Place 1 drop into both eyes daily as needed (for seasonal allergies). ) 7.5 mL 0  . pantoprazole (PROTONIX) 40 MG tablet Take 1 tablet (40 mg total) by mouth 2 (two) times daily. 180 tablet 2  . RA LAXATIVE 5 MG EC tablet See admin instructions.  0  . SYNTHROID 125 MCG tablet TAKE 1 TABLET DAILY (Patient taking differently: Take 125 mcg by mouth once a day) 90 tablet 3  . [DISCONTINUED] bethanechol (URECHOLINE) 5 MG tablet Take 1 tablet (5 mg total) by mouth 3 (three) times daily. 270 tablet 3   No current facility-administered medications on file prior to visit.    Corn-containing products; Peanut-containing drug products; Soy allergy; Amoxicillin; and Other  Review of Systems  All other systems reviewed and are negative.  Vitals:   04/29/17 1623  BP: 136/88  Pulse: 92  Resp: 16  Weight: 232 lb (105.2 kg)  Height: 5\' 7"  (1.702 m)    Gen:  WNWF healthy female NAD Abdomen: soft, non-tender Groin:  no inguinal nodes palpated  Pelvic exam: Vulva:  normal female genitalia Vagina:  normal  vagina, no discharge, exudate, lesion, or erythema Cervix:  Non-tender, Negative CMT, no lesions or redness. Uterus:  normal shape, position and consistency   Procedure:  Speculum reinserted.  Cervix visualized and cleansed with Betadine x 3.  Paracervical block was not placed.  Single toothed tenaculum applied to anterior lip of cervix without difficulty.  Uterus sounded to 7cm.  Lot number: [id number].  Expiration:  2/21.  IUD package was opened.  IUD and introducer passed to fundus and then withdrawn slightly before IUD was passed into endometrial cavity.  Introducer removed.  Strings cut to 2cm.  Tenaculum removed from cervix.  Minimal bleeding noted.  Pt tolerated the procedure well.  All instruments removed  from vagina.  A: Insertion of Kyleena IUD  P:  Return for recheck 6-8 weeks Pt aware to call for any concerns Pt aware removal due no later than 04/30/2022.  IUD card given to pt.

## 2017-08-06 ENCOUNTER — Other Ambulatory Visit: Payer: Self-pay | Admitting: Allergy and Immunology

## 2017-08-23 ENCOUNTER — Ambulatory Visit: Payer: 59 | Admitting: Obstetrics & Gynecology

## 2017-08-23 ENCOUNTER — Encounter: Payer: Self-pay | Admitting: Obstetrics & Gynecology

## 2017-08-23 NOTE — Progress Notes (Deleted)
22 y.o. G0P0000 SingleCaucasianF here for annual exam.    No LMP recorded.          Sexually active: {yes no:314532}  The current method of family planning is IUD. Kyleena placed 04/29/17 Exercising: {yes ZO:109604}no:314532}  {types:19826} Smoker:  {YES VW:09811}NO:22349}  Health Maintenance: Pap:  01/14/16 Neg  History of abnormal Pap:  no MMG:  Never Colonoscopy:  2014 f/u 5 years  TDaP:  2010 Pneumonia vaccine(s):  2016 Screening Labs: ***, Hb today: ***, Urine today: ***   reports that she has never smoked. She has never used smokeless tobacco. She reports that she does not drink alcohol or use drugs.  Past Medical History:  Diagnosis Date  . Abnormal liver function tests   . Abnormal thyroid function test   . Acanthosis   . Allergic rhinitis   . Asthma   . Combined hyperlipidemia   . Concussion   . Diabetes mellitus (HCC)   . Diabetes mellitus without complication (HCC)   . Dyspepsia   . Eczema   . Erythema nodosum   . Gastroesophageal reflux   . Goiter    hypothyroidsm--hashimoto's  . Hypertension   . Iron deficiency anemia   . Lactose intolerance   . Obesity   . Prediabetes   . Psoriatic arthritis [facility](HCC)     Past Surgical History:  Procedure Laterality Date  . ANKLE SURGERY Right    --bone spur  . KNEE SURGERY Left 2014   repair of ACL  . sinuses    . TONSILLECTOMY AND ADENOIDECTOMY    . TYMPANOSTOMY TUBE PLACEMENT    . WISDOM TOOTH EXTRACTION      Current Outpatient Medications  Medication Sig Dispense Refill  . acetaminophen (TYLENOL) 500 MG tablet Take 1,000 mg by mouth every 4 (four) hours as needed for fever (alternating with Ibuprofen). Reported on 01/15/2015    . albuterol (PROAIR HFA) 108 (90 Base) MCG/ACT inhaler Inhale two puffs every four to six hours as needed for cough or wheeze. 3 Inhaler 1  . albuterol (PROVENTIL) (2.5 MG/3ML) 0.083% nebulizer solution Take 2.5 mg by nebulization every 6 (six) hours as needed for wheezing. Reported on 01/15/2015    .  amphetamine-dextroamphetamine (ADDERALL XR) 20 MG 24 hr capsule Take 20 mg by mouth daily.  0  . amphetamine-dextroamphetamine (ADDERALL) 10 MG tablet Take 1 tablet by mouth daily.  0  . B Complex-C (B-COMPLEX WITH VITAMIN C) tablet Take 1 tablet by mouth daily.     Marland Kitchen. BREO ELLIPTA 200-25 MCG/INH AEPB USE 1 INHALATION BY MOUTH  DAILY.  RINSE, GARGLE AND  SPIT AFTER USE. 180 each 1  . Calcium Carb-Cholecalciferol (CALCIUM-VITAMIN D) 500-200 MG-UNIT tablet Take by mouth daily.    . clobetasol ointment (TEMOVATE) 0.05 % Apply 1 application topically 2 (two) times daily. Apply as directed twice daily 60 g 1  . dapsone 100 MG tablet Take 50 mg by mouth daily.  0  . desonide (DESOWEN) 0.05 % cream apply to affected area once daily if needed (Patient taking differently: Apply to affected areas once a day as needed for itchy skin) 60 g 5  . diclofenac sodium (VOLTAREN) 1 % GEL APP EXT AA QID UTD PRN  6  . EPINEPHrine (EPIPEN 2-PAK) 0.3 mg/0.3 mL IJ SOAJ injection Use as directed for life-threatening allergic reaction. 2 Device 2  . ezetimibe (ZETIA) 10 MG tablet TAKE 1 TABLET DAILY (Patient taking differently: Take 10 mg by mouth once a day) 90 tablet 2  .  ibuprofen (ADVIL,MOTRIN) 200 MG tablet Take 600 mg by mouth every 6 (six) hours as needed for fever (alternating with Tylenol). Reported on 01/15/2015    . ibuprofen (ADVIL,MOTRIN) 800 MG tablet daily as needed.  2  . inFLIXimab in sodium chloride 0.9 % Inject into the vein.    Marland Kitchen levalbuterol (XOPENEX HFA) 45 MCG/ACT inhaler Inhale into the lungs daily as needed.    Marland Kitchen levocetirizine (XYZAL) 5 MG tablet Take one tablet by mouth every evening if needed. 90 tablet 2  . metFORMIN (GLUCOPHAGE) 1000 MG tablet TAKE 1 TABLET TWICE A DAY WITH A MEAL (Patient taking differently: Take 1,000 mg by mouth two times a day with a meal) 180 tablet 6  . montelukast (SINGULAIR) 10 MG tablet Take by mouth daily as needed.    . Multiple Vitamin (MULTIVITAMIN WITH MINERALS)  TABS tablet Take 1 tablet by mouth daily.    . Olopatadine HCl (PATADAY) 0.2 % SOLN Use one drop in each eye once daily as needed. (Patient taking differently: Place 1 drop into both eyes daily as needed (for seasonal allergies). ) 7.5 mL 0  . pantoprazole (PROTONIX) 40 MG tablet Take 1 tablet (40 mg total) by mouth 2 (two) times daily. 180 tablet 2  . RA LAXATIVE 5 MG EC tablet See admin instructions.  0  . SYNTHROID 125 MCG tablet TAKE 1 TABLET DAILY (Patient taking differently: Take 125 mcg by mouth once a day) 90 tablet 3   No current facility-administered medications for this visit.     Family History  Problem Relation Age of Onset  . Diabetes Mother   . Lupus Mother   . Hyperlipidemia Father   . Hypertension Father   . Diabetes Father   . Colon cancer Father   . Thyroid disease Brother   . Eczema Brother   . Hyperlipidemia Brother   . Diabetes Brother        pre diabetic  . Cholelithiasis Maternal Grandmother   . Diabetes Maternal Grandmother   . Cervical cancer Maternal Grandmother   . Stroke Maternal Grandmother   . Cancer Maternal Grandfather   . Diabetes Maternal Grandfather   . Stroke Maternal Grandfather   . Diabetes Paternal Grandmother   . Diabetes Paternal Grandfather     ROS  Exam:   There were no vitals taken for this visit.  Height:      Ht Readings from Last 3 Encounters:  04/29/17 5\' 7"  (1.702 m)  08/31/16 5\' 7"  (1.702 m)  07/18/16 5\' 7"  (1.702 m)    General appearance: alert, cooperative and appears stated age Head: Normocephalic, without obvious abnormality, atraumatic Neck: no adenopathy, supple, symmetrical, trachea midline and thyroid {EXAM; THYROID:18604} Lungs: clear to auscultation bilaterally Breasts: {Exam; breast:13139::"normal appearance, no masses or tenderness"} Heart: regular rate and rhythm Abdomen: soft, non-tender; bowel sounds normal; no masses,  no organomegaly Extremities: extremities normal, atraumatic, no cyanosis or  edema Skin: Skin color, texture, turgor normal. No rashes or lesions Lymph nodes: Cervical, supraclavicular, and axillary nodes normal. No abnormal inguinal nodes palpated Neurologic: Grossly normal   Pelvic: External genitalia:  no lesions              Urethra:  normal appearing urethra with no masses, tenderness or lesions              Bartholins and Skenes: normal                 Vagina: normal appearing vagina with normal color and discharge,  no lesions              Cervix: {exam; cervix:14595}              Pap taken: {yes no:314532} Bimanual Exam:  Uterus:  {exam; uterus:12215}              Adnexa: {exam; adnexa:12223}               Rectovaginal: Confirms               Anus:  normal sphincter tone, no lesions  Chaperone was present for exam.  A:  Well Woman with normal exam  P:   {plan; gyn:5269::"mammogram","pap smear","return annually or prn"}

## 2017-09-28 ENCOUNTER — Ambulatory Visit
Admission: RE | Admit: 2017-09-28 | Discharge: 2017-09-28 | Disposition: A | Discharge status: Home / Self Care | Payer: 59 | Source: Ambulatory Visit | Attending: Physician Assistant | Admitting: Physician Assistant

## 2017-09-28 ENCOUNTER — Other Ambulatory Visit: Payer: Self-pay | Admitting: Physician Assistant

## 2017-09-28 DIAGNOSIS — R05 Cough: Secondary | ICD-10-CM

## 2017-09-28 DIAGNOSIS — R509 Fever, unspecified: Secondary | ICD-10-CM

## 2017-09-28 DIAGNOSIS — R059 Cough, unspecified: Secondary | ICD-10-CM

## 2017-10-28 ENCOUNTER — Ambulatory Visit (INDEPENDENT_AMBULATORY_CARE_PROVIDER_SITE_OTHER): Payer: 59 | Admitting: Internal Medicine

## 2017-10-28 ENCOUNTER — Encounter: Payer: Self-pay | Admitting: Internal Medicine

## 2017-10-28 VITALS — BP 130/80 | HR 87 | Ht 67.0 in | Wt 235.0 lb

## 2017-10-28 DIAGNOSIS — E063 Autoimmune thyroiditis: Secondary | ICD-10-CM

## 2017-10-28 DIAGNOSIS — E038 Other specified hypothyroidism: Secondary | ICD-10-CM | POA: Diagnosis not present

## 2017-10-28 DIAGNOSIS — Z23 Encounter for immunization: Secondary | ICD-10-CM

## 2017-10-28 DIAGNOSIS — R7303 Prediabetes: Secondary | ICD-10-CM | POA: Diagnosis not present

## 2017-10-28 LAB — TSH: TSH: 4.23 u[IU]/mL (ref 0.35–4.50)

## 2017-10-28 LAB — T4, FREE: FREE T4: 0.84 ng/dL (ref 0.60–1.60)

## 2017-10-28 LAB — MICROALBUMIN / CREATININE URINE RATIO
CREATININE, U: 262.3 mg/dL
MICROALB UR: 3.6 mg/dL — AB (ref 0.0–1.9)
MICROALB/CREAT RATIO: 1.4 mg/g (ref 0.0–30.0)

## 2017-10-28 LAB — LIPID PANEL
CHOLESTEROL: 159 mg/dL (ref 0–200)
HDL: 61.1 mg/dL (ref >39.00)
LDL Cholesterol: 73 mg/dL (ref 0–99)
NONHDL: 97.6
Total CHOL/HDL Ratio: 3
Triglycerides: 125 mg/dL (ref 0.0–149.0)
VLDL: 25 mg/dL (ref 0.0–40.0)

## 2017-10-28 LAB — T3, FREE: T3 FREE: 3.5 pg/mL (ref 2.3–4.2)

## 2017-10-28 LAB — HEMOGLOBIN A1C: HEMOGLOBIN A1C: 4.4 % — AB (ref 4.6–6.5)

## 2017-10-28 NOTE — Patient Instructions (Addendum)
Please stop at the lab.  Take the thyroid hormone every day, with water, at least 30 minutes before breakfast, separated by at least 4 hours from: - acid reflux medications - calcium - iron - multivitamins  Please stop at the lab.  Please come back in December.

## 2017-10-28 NOTE — Progress Notes (Signed)
Patient ID: Valerie Marquez, female   DOB: Jan 10, 1996, 22 y.o.   MRN: [id number]    HPI  Valerie Marquez is a 22 y.o.-year-old female, referred by her PCP, Redmon, Noelle, PA-C, for management of Hashimoto's hypothyroidism and Prediabetes.  She was previously seen in the pediatric endocrinology clinic and would like to transition to adult Endocrinology.  In 01/2018, she will move to [facility] where she just accepted a job with [employer].  Pt. has been dx with hypothyroidism in 2003 >> on Synthroid d.a.w. 125 mcg (stable dose for 5 years). She does not think she tried the generic LT4.  She takes the thyroid hormone: - fasting - with water - separated by 45-60 min from b'fast  - no calcium, iron (stoped - had iron infusions), multivitamins  - + Protonix at the same time as levothyroxine! - not on B complex  I reviewed pt's thyroid tests: Lab Results  Component Value Date   TSH 1.35 03/31/2016   TSH 0.98 01/15/2016   TSH 1.170 01/15/2015   TSH 0.765 01/09/2014   TSH 1.523 05/10/2013   TSH 0.105 (L) 10/31/2012   TSH 0.789 08/02/2012   TSH 2.430 03/18/2012   TSH 1.318 12/18/2011   TSH 2.308 04/13/2011   FREET4 1.6 03/31/2016   FREET4 1.4 01/15/2016   FREET4 1.40 01/15/2015   FREET4 1.22 01/09/2014   FREET4 1.60 05/10/2013   FREET4 1.76 10/31/2012   FREET4 1.45 08/02/2012   FREET4 1.43 03/18/2012   FREET4 1.55 12/18/2011   FREET4 1.59 04/13/2011   T3FREE 3.3 03/31/2016   T3FREE 3.2 10/31/2012   T3FREE 3.4 08/02/2012   T3FREE 3.0 03/18/2012   T3FREE 2.9 12/18/2011   T3FREE 3.4 04/13/2011   T3FREE 3.4 01/12/2011   T3FREE 3.3 09/01/2010   Antithyroid antibodies: No results found for: THGAB No components found for: TPOAB  Patient mentions: Fatigue Heat intolerance Diarrhea and constipation Weight gain and weight loss But no anxiety or depression  Pt denies feeling nodules in neck, hoarseness, dysphagia/odynophagia, SOB with lying down.  She has +  FH of thyroid disorders in: mother,brother. No FH of thyroid cancer.  No h/o radiation tx to head or neck. No recent use of iodine supplements.  Prediabetes: - on Metformin 1000 mg 2x a day.  Lab Results  Component Value Date   HGBA1C 5.3 03/31/2016   HGBA1C 5.4 01/15/2016   HGBA1C 5.7 01/15/2015   HGBA1C 6.0 (H) 11/14/2014   HGBA1C 5.4 05/24/2014   HGBA1C 5.7 01/09/2014   HGBA1C 5.2 05/10/2013   HGBA1C 5.6 10/31/2012   HGBA1C 5.3 08/02/2012   HGBA1C 4.9d 08/02/2012   Meals: - Breakfast: Granola bar - Lunch: At [facility]: Meat, ascites, salad + Gatorade or water - Dinner: Same as lunch - Snacks: 2 if have time  She is going to the gym: 5x gym, 1 day recovery, light exercise 1x a week. Works in [employer] >> very active at work.  She has erythema nodosum and psoriatic arthritis >> occasional Prednisone tapers (1-2x a year). Last flare of EN 02/2017.  + HL, last lipid panel: Lab Results  Component Value Date   CHOL 143 01/15/2016   HDL 57 01/15/2016   LDLCALC 69 01/15/2016   TRIG 83 01/15/2016   CHOLHDL 2.5 01/15/2016  On Zetia.  No CKD: Lab Results  Component Value Date   BUN 10 07/19/2016   Lab Results  Component Value Date   CREATININE 0.69 07/19/2016   No results found for:  MICRALBCREAT   Last eye exam: 09/2017: No DR  No numbness/tingling in feet.  She tested negative for celiac ds - 3x EGDs.   She has an IUD so menstrual cycles have not been predictable.  ROS: Constitutional: + Fatigue, + weight gain/weight loss, + heat intolerance, + hot flashes, + poor sleep Eyes: + Blurry vision, no xerophthalmia ENT: + Sore throat, no nodules palpated in throat, no dysphagia/odynophagia, no hoarseness Cardiovascular: no CP/SOB/palpitations/leg swelling Respiratory: + Cough/no SOB Gastrointestinal: + All: Heartburn/nausea/diarrhea/constipation Musculoskeletal: + Both: Muscle/joint aches Skin: no rashes Neurological: no  tremors/numbness/tingling/dizziness, + headache Psychiatric: no depression/anxiety  Past Medical History:  Diagnosis Date  . Abnormal liver function tests   . Abnormal thyroid function test   . Acanthosis   . Allergic rhinitis   . Asthma   . Combined hyperlipidemia   . Concussion   . Diabetes mellitus (HCC)   . Diabetes mellitus without complication (HCC)   . Dyspepsia   . Eczema   . Erythema nodosum   . Gastroesophageal reflux   . Goiter    hypothyroidsm--hashimoto's  . Hypertension   . Iron deficiency anemia   . Lactose intolerance   . Obesity   . Prediabetes   . Psoriatic arthritis [facility])    Past Surgical History:  Procedure Laterality Date  . ANKLE SURGERY Right    --bone spur  . KNEE SURGERY Left 2014   repair of ACL  . sinuses    . TONSILLECTOMY AND ADENOIDECTOMY    . TYMPANOSTOMY TUBE PLACEMENT    . WISDOM TOOTH EXTRACTION     Social History   Socioeconomic History  . Marital status: Single    Spouse name: Not on file  . Number of children: Not on file  . Years of education: Not on file  . Highest education level: Not on file  Occupational History  . Not on file  Social Needs  . Financial resource strain: Not on file  . Food insecurity:    Worry: Not on file    Inability: Not on file  . Transportation needs:    Medical: Not on file    Non-medical: Not on file  Tobacco Use  . Smoking status: Never Smoker  . Smokeless tobacco: Never Used  Substance and Sexual Activity  . Alcohol use: No    Alcohol/week: 0.0 standard drinks  . Drug use: No  . Sexual activity: Never    Birth control/protection: IUD    Comment: Kyleena inserted 05/08/16   Lifestyle  . Physical activity:    Days per week: Not on file    Minutes per session: Not on file  . Stress: Not on file  Relationships  . Social connections:    Talks on phone: Not on file    Gets together: Not on file    Attends religious service: Not on file    Active member of club or organization: Not  on file    Attends meetings of clubs or organizations: Not on file    Relationship status: Not on file  . Intimate partner violence:    Fear of current or ex partner: Not on file    Emotionally abused: Not on file    Physically abused: Not on file    Forced sexual activity: Not on file  Other Topics Concern  . Not on file  Social History Narrative   Dad deceased 2022/03/07.    Current Outpatient Medications on File Prior to Visit  Medication Sig Dispense Refill  . acetaminophen (  TYLENOL) 500 MG tablet Take 1,000 mg by mouth every 4 (four) hours as needed for fever (alternating with Ibuprofen). Reported on 01/15/2015    . albuterol (PROAIR HFA) 108 (90 Base) MCG/ACT inhaler Inhale two puffs every four to six hours as needed for cough or wheeze. 3 Inhaler 1  . albuterol (PROVENTIL) (2.5 MG/3ML) 0.083% nebulizer solution Take 2.5 mg by nebulization every 6 (six) hours as needed for wheezing. Reported on 01/15/2015    . amphetamine-dextroamphetamine (ADDERALL XR) 20 MG 24 hr capsule Take 20 mg by mouth daily.  0  . amphetamine-dextroamphetamine (ADDERALL) 10 MG tablet Take 1 tablet by mouth daily.  0  . B Complex-C (B-COMPLEX WITH VITAMIN C) tablet Take 1 tablet by mouth daily.     Marland Kitchen BREO ELLIPTA 200-25 MCG/INH AEPB USE 1 INHALATION BY MOUTH  DAILY.  RINSE, GARGLE AND  SPIT AFTER USE. 180 each 1  . Calcium Carb-Cholecalciferol (CALCIUM-VITAMIN D) 500-200 MG-UNIT tablet Take by mouth daily.    . clobetasol ointment (TEMOVATE) 0.05 % Apply 1 application topically 2 (two) times daily. Apply as directed twice daily 60 g 1  . dapsone 100 MG tablet Take 50 mg by mouth daily.  0  . desonide (DESOWEN) 0.05 % cream apply to affected area once daily if needed (Patient taking differently: Apply to affected areas once a day as needed for itchy skin) 60 g 5  . diclofenac sodium (VOLTAREN) 1 % GEL APP EXT AA QID UTD PRN  6  . EPINEPHrine (EPIPEN 2-PAK) 0.3 mg/0.3 mL IJ SOAJ injection Use as directed for  life-threatening allergic reaction. 2 Device 2  . ezetimibe (ZETIA) 10 MG tablet TAKE 1 TABLET DAILY (Patient taking differently: Take 10 mg by mouth once a day) 90 tablet 2  . levalbuterol (XOPENEX HFA) 45 MCG/ACT inhaler Inhale into the lungs daily as needed.    Marland Kitchen levocetirizine (XYZAL) 5 MG tablet Take one tablet by mouth every evening if needed. 90 tablet 2  . metFORMIN (GLUCOPHAGE) 1000 MG tablet TAKE 1 TABLET TWICE A DAY WITH A MEAL (Patient taking differently: Take 1,000 mg by mouth two times a day with a meal) 180 tablet 6  . montelukast (SINGULAIR) 10 MG tablet Take by mouth daily as needed.    . Multiple Vitamin (MULTIVITAMIN WITH MINERALS) TABS tablet Take 1 tablet by mouth daily.    . Olopatadine HCl (PATADAY) 0.2 % SOLN Use one drop in each eye once daily as needed. (Patient taking differently: Place 1 drop into both eyes daily as needed (for seasonal allergies). ) 7.5 mL 0  . pantoprazole (PROTONIX) 40 MG tablet Take 1 tablet (40 mg total) by mouth 2 (two) times daily. 180 tablet 2  . SYNTHROID 125 MCG tablet TAKE 1 TABLET DAILY (Patient taking differently: Take 125 mcg by mouth once a day) 90 tablet 3  . ibuprofen (ADVIL,MOTRIN) 200 MG tablet Take 600 mg by mouth every 6 (six) hours as needed for fever (alternating with Tylenol). Reported on 01/15/2015    . ibuprofen (ADVIL,MOTRIN) 800 MG tablet daily as needed.  2  . inFLIXimab in sodium chloride 0.9 % Inject into the vein.    Marland Kitchen RA LAXATIVE 5 MG EC tablet See admin instructions.  0  . [DISCONTINUED] bethanechol (URECHOLINE) 5 MG tablet Take 1 tablet (5 mg total) by mouth 3 (three) times daily. 270 tablet 3   No current facility-administered medications on file prior to visit.    Allergies  Allergen Reactions  . Corn-Containing Products  Itching  . Peanut-Containing Drug Products Itching  . Soy Allergy Itching  . Amoxicillin Nausea And Vomiting and Rash  . Other Itching and Other (See Comments)    Also, tree nuts, cats, and  dogs (pet dander) - per allergy test   Family History  Problem Relation Age of Onset  . Diabetes Mother   . Lupus Mother   . Hyperlipidemia Father   . Hypertension Father   . Diabetes Father   . Colon cancer Father   . Thyroid disease Brother   . Eczema Brother   . Hyperlipidemia Brother   . Diabetes Brother        pre diabetic  . Cholelithiasis Maternal Grandmother   . Diabetes Maternal Grandmother   . Cervical cancer Maternal Grandmother   . Stroke Maternal Grandmother   . Cancer Maternal Grandfather   . Diabetes Maternal Grandfather   . Stroke Maternal Grandfather   . Diabetes Paternal Grandmother   . Diabetes Paternal Grandfather     PE: BP 130/80   Pulse 87   Ht 5\' 7"  (1.702 m)   Wt 235 lb (106.6 kg)   SpO2 98%   BMI 36.81 kg/m  Wt Readings from Last 3 Encounters:  10/28/17 235 lb (106.6 kg)  04/29/17 232 lb (105.2 kg)  01/01/17 242 lb (109.8 kg)   Constitutional: overweight, in NAD Eyes: PERRLA, EOMI, no exophthalmos ENT: moist mucous membranes, no thyromegaly, no cervical lymphadenopathy Cardiovascular: RRR, No MRG Respiratory: CTA B Gastrointestinal: abdomen soft, NT, ND, BS+ Musculoskeletal: no deformities, strength intact in all 4 Skin: moist, warm, no rashes Neurological: no tremor with outstretched hands, DTR normal in all 4  ASSESSMENT: 1. Hypothyroidism 2/2 Hashimoto's thyroiditis  2.  Prediabetes  PLAN:  1. Patient with long-standing hypothyroidism, on levothyroxine d.a.w. treatment. - Latest TFTs were reviewed and these were normal, but these were checked more than a year ago. - she appears euthyroid.  - she does not appear to have a goiter, thyroid nodules, or neck compression symptoms -We discussed that Hashimoto thyroiditis is an autoimmune disorder, in which she develops antibodies against her own thyroid. The antibodies bind to the thyroid tissue and cause inflammation, and, eventually, destruction of the gland and hypothyroidism.  - We  discussed about correct intake of levothyroxine, fasting, with water, separated by at least 30 minutes from breakfast, and separated by more than 4 hours from calcium, iron, multivitamins, acid reflux medications (PPIs).  As of now, she is taking the levothyroxine along with Protonix.  I explained that by doing this, her levothyroxine is not absorbed completely.  I advised her to move Protonix at least 4 hours after levothyroxine - will check thyroid tests today: TSH, free T4, free T3 and will need to decrease the dose of levothyroxine afterwards as she will now start absorbing it well - If labs today are abnormal, she will need to return in ~6 weeks for repeat labs - Otherwise, I will see her back in 3 months, before she leaves for [facility]  3.  Prediabetes -This appears to be well controlled per review of her previous HbA1c levels -She continues on metformin 1000 mg twice a day with meals, tolerating this well -She remains active, exercising at the gym most days of the week and being active at work -She lost 12 pounds in the last year -At this visit, we will check her HbA1c level -For now, I do not feel she needs to start checking sugars at home, but if the  HbA1c starts to increase, she will need to start doing so -We did discuss that we can start decreasing the metformin dose if her HbA1c is as low as last time -At this visit, we will also check an ACR, CMP, lipid panel, along with her HbA1c -Given flu shot today  Orders Placed This Encounter  Procedures  . TSH  . T4, free  . T3, free  . Microalbumin / creatinine urine ratio  . COMPLETE METABOLIC PANEL WITH GFR  . Lipid panel  . Hemoglobin A1c    Component     Latest Ref Rng & Units 10/28/2017          Glucose     65 - 99 mg/dL 161 (H)  BUN     7 - 25 mg/dL 11  Creatinine     0.96 - 1.10 mg/dL 0.45  GFR, Est Non African American     > OR = 60 mL/min/1.60m2 125  GFR, Est African American     > OR = 60 mL/min/1.53m2 145   BUN/Creatinine Ratio     6 - 22 (calc) NOT APPLICABLE  Sodium     135 - 146 mmol/L 136  Potassium     3.5 - 5.3 mmol/L 3.8  Chloride     98 - 110 mmol/L 105  CO2     20 - 32 mmol/L 21  Calcium     8.6 - 10.2 mg/dL 9.3  Total Protein     6.1 - 8.1 g/dL 6.7  Albumin MSPROF     3.6 - 5.1 g/dL 4.2  Globulin     1.9 - 3.7 g/dL (calc) 2.5  AG Ratio     1.0 - 2.5 (calc) 1.7  Total Bilirubin     0.2 - 1.2 mg/dL 0.4  Alkaline phosphatase (APISO)     33 - 115 U/L 40  AST     10 - 30 U/L 21  ALT     6 - 29 U/L 38 (H)  Cholesterol     0 - 200 mg/dL 409  Triglycerides     0.0 - 149.0 mg/dL 811.9  HDL Cholesterol     >39.00 mg/dL 14.78  VLDL     0.0 - 29.5 mg/dL 62.1  LDL (calc)     0 - 99 mg/dL 73  Total CHOL/HDL Ratio      3  NonHDL      97.60  Microalb, Ur     0.0 - 1.9 mg/dL 3.6 (H)  Creatinine,U     mg/dL 308.6  MICROALB/CREAT RATIO     0.0 - 30.0 mg/g 1.4  Hemoglobin A1C     4.6 - 6.5 % 4.4 (L)  Triiodothyronine,Free,Serum     2.3 - 4.2 pg/mL 3.5  T4,Free(Direct)     0.60 - 1.60 ng/dL 5.78  TSH     4.69 - 6.29 uIU/mL 4.23   Labs are normal with the exception of a slightly elevated ALT.  Per review of previous ultrasound, she has hepatic steatosis.  Because the TSH is at the upper limit of normal, will decrease the LT4 dose only to 112 mcg daily as she will start separate Protonix from the LT4. Will recheck labs in 1.5 mo.  Carlus Pavlov, MD PhD [facility] Endocrinology

## 2017-10-29 DIAGNOSIS — R7303 Prediabetes: Secondary | ICD-10-CM | POA: Insufficient documentation

## 2017-10-29 LAB — COMPLETE METABOLIC PANEL WITH GFR
AG RATIO: 1.7 (calc) (ref 1.0–2.5)
ALBUMIN MSPROF: 4.2 g/dL (ref 3.6–5.1)
ALT: 38 U/L — ABNORMAL HIGH (ref 6–29)
AST: 21 U/L (ref 10–30)
Alkaline phosphatase (APISO): 40 U/L (ref 33–115)
BUN: 11 mg/dL (ref 7–25)
CALCIUM: 9.3 mg/dL (ref 8.6–10.2)
CO2: 21 mmol/L (ref 20–32)
CREATININE: 0.67 mg/dL (ref 0.50–1.10)
Chloride: 105 mmol/L (ref 98–110)
GFR, EST AFRICAN AMERICAN: 145 mL/min/{1.73_m2} (ref >=60)
GFR, EST NON AFRICAN AMERICAN: 125 mL/min/{1.73_m2} (ref >=60)
GLOBULIN: 2.5 g/dL (ref 1.9–3.7)
Glucose, Bld: 103 mg/dL — ABNORMAL HIGH (ref 65–99)
POTASSIUM: 3.8 mmol/L (ref 3.5–5.3)
SODIUM: 136 mmol/L (ref 135–146)
TOTAL PROTEIN: 6.7 g/dL (ref 6.1–8.1)
Total Bilirubin: 0.4 mg/dL (ref 0.2–1.2)

## 2017-10-29 MED ORDER — SYNTHROID 112 MCG PO TABS: 112.0000 ug | ORAL_TABLET | Freq: Every day | ORAL | 5 refills | Status: DC

## 2017-11-01 ENCOUNTER — Telehealth: Payer: Self-pay

## 2017-11-02 ENCOUNTER — Ambulatory Visit: Payer: Self-pay | Admitting: Obstetrics & Gynecology

## 2017-11-02 ENCOUNTER — Encounter: Payer: Self-pay | Admitting: Obstetrics & Gynecology

## 2017-11-02 NOTE — Progress Notes (Deleted)
22 y.o. G0P0000 Single White or Caucasian female here for annual exam.    No LMP recorded. (Menstrual status: IUD).          Sexually active: {yes no:314532}  The current method of family planning is IUD. Kyleena placed 04/29/17 Exercising: {yes IO:962952}  {types:19826} Smoker:  {YES WU:13244}  Health Maintenance: Pap:  01/14/16 Neg  History of abnormal Pap:  no Colonoscopy:  04/26/12 Normal  TDaP:  2010 Gardasil: completed Pneumonia vaccine(s):  2016 Screening Labs: ***, Hb today: ***, Urine today: ***   reports that she has never smoked. She has never used smokeless tobacco. She reports that she does not drink alcohol or use drugs.  Past Medical History:  Diagnosis Date  . Abnormal liver function tests   . Abnormal thyroid function test   . Acanthosis   . Allergic rhinitis   . Asthma   . Combined hyperlipidemia   . Concussion   . Diabetes mellitus (HCC)   . Diabetes mellitus without complication (HCC)   . Dyspepsia   . Eczema   . Erythema nodosum   . Gastroesophageal reflux   . Goiter    hypothyroidsm--hashimoto's  . Hypertension   . Iron deficiency anemia   . Lactose intolerance   . Obesity   . Prediabetes   . Psoriatic arthritis [facility])     Past Surgical History:  Procedure Laterality Date  . ANKLE SURGERY Right    --bone spur  . KNEE SURGERY Left 2014   repair of ACL  . sinuses    . TONSILLECTOMY AND ADENOIDECTOMY    . TYMPANOSTOMY TUBE PLACEMENT    . WISDOM TOOTH EXTRACTION      Current Outpatient Medications  Medication Sig Dispense Refill  . acetaminophen (TYLENOL) 500 MG tablet Take 1,000 mg by mouth every 4 (four) hours as needed for fever (alternating with Ibuprofen). Reported on 01/15/2015    . albuterol (PROAIR HFA) 108 (90 Base) MCG/ACT inhaler Inhale two puffs every four to six hours as needed for cough or wheeze. 3 Inhaler 1  . albuterol (PROVENTIL) (2.5 MG/3ML) 0.083% nebulizer solution Take 2.5 mg by nebulization every 6 (six) hours as needed  for wheezing. Reported on 01/15/2015    . amphetamine-dextroamphetamine (ADDERALL XR) 20 MG 24 hr capsule Take 20 mg by mouth daily.  0  . amphetamine-dextroamphetamine (ADDERALL) 10 MG tablet Take 1 tablet by mouth daily.  0  . B Complex-C (B-COMPLEX WITH VITAMIN C) tablet Take 1 tablet by mouth daily.     Marland Kitchen BREO ELLIPTA 200-25 MCG/INH AEPB USE 1 INHALATION BY MOUTH  DAILY.  RINSE, GARGLE AND  SPIT AFTER USE. 180 each 1  . Calcium Carb-Cholecalciferol (CALCIUM-VITAMIN D) 500-200 MG-UNIT tablet Take by mouth daily.    . clobetasol ointment (TEMOVATE) 0.05 % Apply 1 application topically 2 (two) times daily. Apply as directed twice daily 60 g 1  . dapsone 100 MG tablet Take 50 mg by mouth daily.  0  . desonide (DESOWEN) 0.05 % cream apply to affected area once daily if needed (Patient taking differently: Apply to affected areas once a day as needed for itchy skin) 60 g 5  . diclofenac sodium (VOLTAREN) 1 % GEL APP EXT AA QID UTD PRN  6  . EPINEPHrine (EPIPEN 2-PAK) 0.3 mg/0.3 mL IJ SOAJ injection Use as directed for life-threatening allergic reaction. 2 Device 2  . ezetimibe (ZETIA) 10 MG tablet TAKE 1 TABLET DAILY (Patient taking differently: Take 10 mg by mouth once a day) 90  tablet 2  . ibuprofen (ADVIL,MOTRIN) 200 MG tablet Take 600 mg by mouth every 6 (six) hours as needed for fever (alternating with Tylenol). Reported on 01/15/2015    . ibuprofen (ADVIL,MOTRIN) 800 MG tablet daily as needed.  2  . inFLIXimab in sodium chloride 0.9 % Inject into the vein.    Marland Kitchen levalbuterol (XOPENEX HFA) 45 MCG/ACT inhaler Inhale into the lungs daily as needed.    Marland Kitchen levocetirizine (XYZAL) 5 MG tablet Take one tablet by mouth every evening if needed. 90 tablet 2  . metFORMIN (GLUCOPHAGE) 1000 MG tablet TAKE 1 TABLET TWICE A DAY WITH A MEAL (Patient taking differently: Take 1,000 mg by mouth two times a day with a meal) 180 tablet 6  . montelukast (SINGULAIR) 10 MG tablet Take by mouth daily as needed.    .  Multiple Vitamin (MULTIVITAMIN WITH MINERALS) TABS tablet Take 1 tablet by mouth daily.    . Olopatadine HCl (PATADAY) 0.2 % SOLN Use one drop in each eye once daily as needed. (Patient taking differently: Place 1 drop into both eyes daily as needed (for seasonal allergies). ) 7.5 mL 0  . pantoprazole (PROTONIX) 40 MG tablet Take 1 tablet (40 mg total) by mouth 2 (two) times daily. 180 tablet 2  . RA LAXATIVE 5 MG EC tablet See admin instructions.  0  . SYNTHROID 112 MCG tablet Take 1 tablet (112 mcg total) by mouth daily before breakfast. 45 tablet 5   No current facility-administered medications for this visit.     Family History  Problem Relation Age of Onset  . Diabetes Mother   . Lupus Mother   . Hyperlipidemia Father   . Hypertension Father   . Diabetes Father   . Colon cancer Father   . Thyroid disease Brother   . Eczema Brother   . Hyperlipidemia Brother   . Diabetes Brother        pre diabetic  . Cholelithiasis Maternal Grandmother   . Diabetes Maternal Grandmother   . Cervical cancer Maternal Grandmother   . Stroke Maternal Grandmother   . Cancer Maternal Grandfather   . Diabetes Maternal Grandfather   . Stroke Maternal Grandfather   . Diabetes Paternal Grandmother   . Diabetes Paternal Grandfather     Review of Systems  Exam:   There were no vitals taken for this visit.  Height:      Ht Readings from Last 3 Encounters:  10/28/17 5\' 7"  (1.702 m)  04/29/17 5\' 7"  (1.702 m)  08/31/16 5\' 7"  (1.702 m)    General appearance: alert, cooperative and appears stated age Head: Normocephalic, without obvious abnormality, atraumatic Neck: no adenopathy, supple, symmetrical, trachea midline and thyroid {EXAM; THYROID:18604} Lungs: clear to auscultation bilaterally Breasts: {Exam; breast:13139::"normal appearance, no masses or tenderness"} Heart: regular rate and rhythm Abdomen: soft, non-tender; bowel sounds normal; no masses,  no organomegaly Extremities: extremities  normal, atraumatic, no cyanosis or edema Skin: Skin color, texture, turgor normal. No rashes or lesions Lymph nodes: Cervical, supraclavicular, and axillary nodes normal. No abnormal inguinal nodes palpated Neurologic: Grossly normal   Pelvic: External genitalia:  no lesions              Urethra:  normal appearing urethra with no masses, tenderness or lesions              Bartholins and Skenes: normal                 Vagina: normal appearing vagina with normal color  and discharge, no lesions              Cervix: {exam; cervix:14595}              Pap taken: {yes no:314532} Bimanual Exam:  Uterus:  {exam; uterus:12215}              Adnexa: {exam; adnexa:12223}               Rectovaginal: Confirms               Anus:  normal sphincter tone, no lesions  Chaperone was present for exam.  A:  Well Woman with normal exam  P:   {plan; gyn:5269::"mammogram","pap smear","return annually or prn"}

## 2017-11-11 ENCOUNTER — Other Ambulatory Visit: Payer: Self-pay | Admitting: Allergy and Immunology

## 2017-11-24 NOTE — Telephone Encounter (Signed)
Unable to contact

## 2017-12-07 ENCOUNTER — Ambulatory Visit: Payer: 59 | Admitting: Allergy and Immunology

## 2017-12-07 DIAGNOSIS — J309 Allergic rhinitis, unspecified: Secondary | ICD-10-CM

## 2018-01-13 ENCOUNTER — Other Ambulatory Visit: Payer: Self-pay

## 2018-01-13 ENCOUNTER — Ambulatory Visit: Payer: 59 | Admitting: Internal Medicine

## 2018-01-13 ENCOUNTER — Telehealth: Payer: Self-pay | Admitting: Internal Medicine

## 2018-01-13 MED ORDER — SYNTHROID 112 MCG PO TABS: 112.0000 ug | ORAL_TABLET | Freq: Every day | ORAL | 5 refills | Status: AC

## 2018-01-13 NOTE — Telephone Encounter (Signed)
This has been done.

## 2018-01-13 NOTE — Telephone Encounter (Signed)
MEDICATION: SYNTHROID 112 MCG tablet (generic)  PHARMACY:  [facility] [place] [address]  IS THIS A 90 DAY SUPPLY : yes  IS PATIENT OUT OF MEDICATION: No  IF NOT; HOW MUCH IS LEFT: about 1 week left of this  LAST APPOINTMENT DATE: @10 /07/2017  NEXT APPOINTMENT DATE:@Visit  date not found   DO WE HAVE YOUR PERMISSION TO LEAVE A DETAILED MESSAGE: N/A  OTHER COMMENTS: N/A    **Let patient know to contact pharmacy at the end of the day to make sure medication is ready. **  ** Please notify patient to allow 48-72 hours to process**  **Encourage patient to contact the pharmacy for refills or they can [facility] ref[facility]**

## 2018-01-13 NOTE — Progress Notes (Deleted)
Patient ID: Valerie Marquez, female   DOB: Sep 08, 1995, 22 y.o.   MRN: [id number]    HPI  Valerie Marquez is a 22 y.o.-year-old female, referred by her PCP, Marquez, Noelle, PA-C, for management of Hashimoto's hypothyroidism and Prediabetes.  She was previously seen in the pediatric endocrinology clinic - first visit with me 2.5 mo ago.    In 01/2018, she was moved to [facility] where she accepted a job with [employer].  Pt. has been dx with hypothyroidism in 2003.  At last visit, she was taking Synthroid d.a.w. 125 mcg (stable dose for 5 years).  She did not remember trying the generic levothyroxine.  However, at that time, she was taking Protonix along with levothyroxine and I advised him to move Protonix later in the day, separated by at least 4 hours from her thyroid medication.  We also decrease the dose of her Synthroid from 125 to 112 mcg daily then.  Pt is on Synthroid 112 Mcg daily, taken: - in am - fasting - at least 30 min from b'fast - no Ca, Fe, MVI - + PPIs at night - not on Biotin  Reviewed her TFTs: Lab Results  Component Value Date   TSH 4.23 10/28/2017   TSH 1.35 03/31/2016   TSH 0.98 01/15/2016   TSH 1.170 01/15/2015   TSH 0.765 01/09/2014   TSH 1.523 05/10/2013   TSH 0.105 (L) 10/31/2012   TSH 0.789 08/02/2012   TSH 2.430 03/18/2012   TSH 1.318 12/18/2011   FREET4 0.84 10/28/2017   FREET4 1.6 03/31/2016   FREET4 1.4 01/15/2016   FREET4 1.40 01/15/2015   FREET4 1.22 01/09/2014   FREET4 1.60 05/10/2013   FREET4 1.76 10/31/2012   FREET4 1.45 08/02/2012   FREET4 1.43 03/18/2012   FREET4 1.55 12/18/2011   T3FREE 3.5 10/28/2017   T3FREE 3.3 03/31/2016   T3FREE 3.2 10/31/2012   T3FREE 3.4 08/02/2012   T3FREE 3.0 03/18/2012   T3FREE 2.9 12/18/2011   T3FREE 3.4 04/13/2011   T3FREE 3.4 01/12/2011   T3FREE 3.3 09/01/2010   Antithyroid antibodies: No results found for: THGAB No components found for: TPOAB  Pt denies: - feeling nodules  in neck - hoarseness - dysphagia - choking - SOB with lying down  She has + FH of thyroid disorders in: mother,brother. No FH of thyroid cancer. No h/o radiation tx to head or neck.  No herbal supplements. No Biotin use. No recent steroids use.   Prediabetes: - on Metformin 1000 mg 2x a day  Reviewed HbA1c levels: Lab Results  Component Value Date   HGBA1C 4.4 (L) 10/28/2017   HGBA1C 5.3 03/31/2016   HGBA1C 5.4 01/15/2016   HGBA1C 5.7 01/15/2015   HGBA1C 6.0 (H) 11/14/2014   HGBA1C 5.4 05/24/2014   HGBA1C 5.7 01/09/2014   HGBA1C 5.2 05/10/2013   HGBA1C 5.6 10/31/2012   HGBA1C 5.3 08/02/2012   Meals: - Breakfast: Granola bar - Lunch: At [facility] Caf: Meat, ascites, salad + Gatorade or water - Dinner: Same as lunch - Snacks: 2 if have time  She goes to the gym 5x a week. Works in [employer] >> very active at work.  She has erythema nodosum and psoriatic arthritis >> She has occasional prednisone tapers - 1-2x a year Last flare of EN 02/2017.  + HL, last lipid panel: Lab Results  Component Value Date   CHOL 159 10/28/2017   HDL 61.10 10/28/2017   LDLCALC 73 10/28/2017   TRIG 125.0 10/28/2017  CHOLHDL 3 10/28/2017  On Zetia.  No CKD: Lab Results  Component Value Date   BUN 11 10/28/2017   Lab Results  Component Value Date   CREATININE 0.67 10/28/2017   ACR normal: Lab Results  Component Value Date   MICRALBCREAT 1.4 10/28/2017    Last eye exam: 09/2017: No DR  No numbness/tingling in feet.  She tested negative for celiac ds - 3x EGDs.   She has an IUD so menstrual cycles have not been predictable.  ROS: Constitutional: no weight gain/no weight loss, no fatigue, no subjective hyperthermia, no subjective hypothermia Eyes: no blurry vision, no xerophthalmia ENT: no sore throat,+ see HPI Cardiovascular: no CP/no SOB/no palpitations/no leg swelling Respiratory: no cough/no SOB/no wheezing Gastrointestinal: no N/no V/no D/no C/no acid  reflux Musculoskeletal: no muscle aches/no joint aches Skin: no rashes, no hair loss Neurological: no tremors/no numbness/no tingling/no dizziness  I reviewed pt's medications, allergies, PMH, social hx, family hx, and changes were documented in the history of present illness. Otherwise, unchanged from my initial visit note.  Past Medical History:  Diagnosis Date  . Abnormal liver function tests   . Abnormal thyroid function test   . Acanthosis   . Allergic rhinitis   . Asthma   . Combined hyperlipidemia   . Concussion   . Diabetes mellitus (HCC)   . Diabetes mellitus without complication (HCC)   . Dyspepsia   . Eczema   . Erythema nodosum   . Gastroesophageal reflux   . Goiter    hypothyroidsm--hashimoto's  . Hypertension   . Iron deficiency anemia   . Lactose intolerance   . Obesity   . Prediabetes   . Psoriatic arthritis [facility])    Past Surgical History:  Procedure Laterality Date  . ANKLE SURGERY Right    --bone spur  . KNEE SURGERY Left 2014   repair of ACL  . sinuses    . TONSILLECTOMY AND ADENOIDECTOMY    . TYMPANOSTOMY TUBE PLACEMENT    . WISDOM TOOTH EXTRACTION     Social History   Socioeconomic History  . Marital status: Single    Spouse name: Not on file  . Number of children: Not on file  . Years of education: Not on file  . Highest education level: Not on file  Occupational History  . Not on file  Social Needs  . Financial resource strain: Not on file  . Food insecurity:    Worry: Not on file    Inability: Not on file  . Transportation needs:    Medical: Not on file    Non-medical: Not on file  Tobacco Use  . Smoking status: Never Smoker  . Smokeless tobacco: Never Used  Substance and Sexual Activity  . Alcohol use: No    Alcohol/week: 0.0 standard drinks  . Drug use: No  . Sexual activity: Never    Birth control/protection: I.U.D.    Comment: Kyleena inserted 05/08/16   Lifestyle  . Physical activity:    Days per week: Not on file     Minutes per session: Not on file  . Stress: Not on file  Relationships  . Social connections:    Talks on phone: Not on file    Gets together: Not on file    Attends religious service: Not on file    Active member of club or organization: Not on file    Attends meetings of clubs or organizations: Not on file    Relationship status: Not on file  .  Intimate partner violence:    Fear of current or ex partner: Not on file    Emotionally abused: Not on file    Physically abused: Not on file    Forced sexual activity: Not on file  Other Topics Concern  . Not on file  Social History Narrative   Dad deceased 05-Mar-2022.    Current Outpatient Medications on File Prior to Visit  Medication Sig Dispense Refill  . acetaminophen (TYLENOL) 500 MG tablet Take 1,000 mg by mouth every 4 (four) hours as needed for fever (alternating with Ibuprofen). Reported on 01/15/2015    . albuterol (PROAIR HFA) 108 (90 Base) MCG/ACT inhaler Inhale two puffs every four to six hours as needed for cough or wheeze. 3 Inhaler 1  . albuterol (PROVENTIL) (2.5 MG/3ML) 0.083% nebulizer solution Take 2.5 mg by nebulization every 6 (six) hours as needed for wheezing. Reported on 01/15/2015    . amphetamine-dextroamphetamine (ADDERALL XR) 20 MG 24 hr capsule Take 20 mg by mouth daily.  0  . amphetamine-dextroamphetamine (ADDERALL) 10 MG tablet Take 1 tablet by mouth daily.  0  . B Complex-C (B-COMPLEX WITH VITAMIN C) tablet Take 1 tablet by mouth daily.     Marland Kitchen BREO ELLIPTA 200-25 MCG/INH AEPB USE 1 INHALATION BY MOUTH  DAILY -  RINSE, GARGLE AND SPIT  AFTER USE 180 each 0  . Calcium Carb-Cholecalciferol (CALCIUM-VITAMIN D) 500-200 MG-UNIT tablet Take by mouth daily.    . clobetasol ointment (TEMOVATE) 0.05 % Apply 1 application topically 2 (two) times daily. Apply as directed twice daily 60 g 1  . dapsone 100 MG tablet Take 50 mg by mouth daily.  0  . desonide (DESOWEN) 0.05 % cream apply to affected area once daily if needed  (Patient taking differently: Apply to affected areas once a day as needed for itchy skin) 60 g 5  . diclofenac sodium (VOLTAREN) 1 % GEL APP EXT AA QID UTD PRN  6  . EPINEPHrine (EPIPEN 2-PAK) 0.3 mg/0.3 mL IJ SOAJ injection Use as directed for life-threatening allergic reaction. 2 Device 2  . ezetimibe (ZETIA) 10 MG tablet TAKE 1 TABLET DAILY (Patient taking differently: Take 10 mg by mouth once a day) 90 tablet 2  . ibuprofen (ADVIL,MOTRIN) 200 MG tablet Take 600 mg by mouth every 6 (six) hours as needed for fever (alternating with Tylenol). Reported on 01/15/2015    . ibuprofen (ADVIL,MOTRIN) 800 MG tablet daily as needed.  2  . inFLIXimab in sodium chloride 0.9 % Inject into the vein.    Marland Kitchen levalbuterol (XOPENEX HFA) 45 MCG/ACT inhaler Inhale into the lungs daily as needed.    Marland Kitchen levocetirizine (XYZAL) 5 MG tablet Take one tablet by mouth every evening if needed. 90 tablet 2  . metFORMIN (GLUCOPHAGE) 1000 MG tablet TAKE 1 TABLET TWICE A DAY WITH A MEAL (Patient taking differently: Take 1,000 mg by mouth two times a day with a meal) 180 tablet 6  . montelukast (SINGULAIR) 10 MG tablet Take by mouth daily as needed.    . Multiple Vitamin (MULTIVITAMIN WITH MINERALS) TABS tablet Take 1 tablet by mouth daily.    . Olopatadine HCl (PATADAY) 0.2 % SOLN Use one drop in each eye once daily as needed. (Patient taking differently: Place 1 drop into both eyes daily as needed (for seasonal allergies). ) 7.5 mL 0  . pantoprazole (PROTONIX) 40 MG tablet Take 1 tablet (40 mg total) by mouth 2 (two) times daily. 180 tablet 2  . RA LAXATIVE  5 MG EC tablet See admin instructions.  0  . SYNTHROID 112 MCG tablet Take 1 tablet (112 mcg total) by mouth daily before breakfast. 45 tablet 5  . [DISCONTINUED] bethanechol (URECHOLINE) 5 MG tablet Take 1 tablet (5 mg total) by mouth 3 (three) times daily. 270 tablet 3   No current facility-administered medications on file prior to visit.    Allergies  Allergen Reactions   . Corn-Containing Products Itching  . Peanut-Containing Drug Products Itching  . Soy Allergy Itching  . Amoxicillin Nausea And Vomiting and Rash  . Other Itching and Other (See Comments)    Also, tree nuts, cats, and dogs (pet dander) - per allergy test   Family History  Problem Relation Age of Onset  . Diabetes Mother   . Lupus Mother   . Hyperlipidemia Father   . Hypertension Father   . Diabetes Father   . Colon cancer Father   . Thyroid disease Brother   . Eczema Brother   . Hyperlipidemia Brother   . Diabetes Brother        pre diabetic  . Cholelithiasis Maternal Grandmother   . Diabetes Maternal Grandmother   . Cervical cancer Maternal Grandmother   . Stroke Maternal Grandmother   . Cancer Maternal Grandfather   . Diabetes Maternal Grandfather   . Stroke Maternal Grandfather   . Diabetes Paternal Grandmother   . Diabetes Paternal Grandfather     PE: There were no vitals taken for this visit. Wt Readings from Last 3 Encounters:  10/28/17 235 lb (106.6 kg)  04/29/17 232 lb (105.2 kg)  01/01/17 242 lb (109.8 kg)   Constitutional: overweight, in NAD Eyes: PERRLA, EOMI, no exophthalmos ENT: moist mucous membranes, no thyromegaly, no cervical lymphadenopathy Cardiovascular: RRR, No MRG Respiratory: CTA B Gastrointestinal: abdomen soft, NT, ND, BS+ Musculoskeletal: no deformities, strength intact in all 4 Skin: moist, warm, no rashes Neurological: no tremor with outstretched hands, DTR normal in all 4  ASSESSMENT: 1. Hypothyroidism 2/2 Hashimoto's thyroiditis  2.  Prediabetes  3.  Obesity  PLAN:  1. Patient with longstanding hypothyroidism, on levothyroxine treatment.  She is on the brand-name Synthroid.  The dose was reduced at last visit. - latest thyroid labs reviewed with pt >> normal  - she continues on Synthroid 112 Mcg daily - pt feels good on this dose. - we discussed about taking the thyroid hormone every day, with water, >30 minutes before  breakfast, separated by >4 hours from acid reflux medications, calcium, iron, multivitamins. Pt. is taking it correctly now.  At last visit, we most Protonix later in the day.  She was taking this along with her levothyroxine - will check thyroid tests today: TSH and fT4 - If labs are abnormal, she will need to return for repeat TFTs in 1.5 months -[place] -I will see her back on an as-needed basis  3.  Prediabetes -Controlled per previous HbA1c levels.  At last check, this was excellent, at 4.4%. -She continues on metformin 1000 mg twice a day, which she tolerates well -She also remains active, exercising at the gym most days of the week and being active at work -We will recheck her HbA1c today -For now I did not advise her to start checking sugars at home, but if the HbA1c starts to increase, she may need to start doing so -We again discussed about the fact that we may now start decreasing metformin, but I am reticent to do it  now that she was moved out of town. -Up-to-date with flu shot  3.  Obesity -She lost a significant amount of weight before last visit -Continues to stay active and pay attention to her diet -Metformin likely also helping with appetite reduction  Carlus Pavlovristina Corry Ihnen, MD PhD [facility]

## 2018-01-21 ENCOUNTER — Telehealth: Payer: Self-pay

## 2018-01-21 NOTE — Telephone Encounter (Signed)
Patient is calling wanting refills on all her meds before the end of the year. [place] and she states Dr Lucie LeatherKozlow knows this. Patient states she  no showed last visit due to her being sick.  Please Advise.

## 2018-01-21 NOTE — Telephone Encounter (Signed)
Spoke with patient and they wanted to be schedule with Dr. Lucie Leatherkozlow but unable to see him until after 01/31/2017 since she is going out of town for the holidays.

## 2018-01-21 NOTE — Telephone Encounter (Signed)
Called and could not leave message she has not been seen since 12/22/16. We would not be able to refill medications since it has been over a yr.

## 2018-01-23 ENCOUNTER — Other Ambulatory Visit: Payer: Self-pay | Admitting: Allergy and Immunology

## 2018-03-15 ENCOUNTER — Other Ambulatory Visit: Payer: Self-pay | Admitting: Allergy and Immunology

## 2018-04-19 ENCOUNTER — Encounter (HOSPITAL_BASED_OUTPATIENT_CLINIC_OR_DEPARTMENT_OTHER): Payer: Self-pay | Admitting: *Deleted

## 2018-04-19 ENCOUNTER — Other Ambulatory Visit: Payer: Self-pay

## 2018-04-19 NOTE — H&P (Signed)
Valerie Marquez is an 23 y.o. female.   Chief Complaint: LEFT INDEX FINGER INJURY   HPI: The patient is a 23 y/o right hand dominant female who hit her finger with a hammer on 03/29/18 causing injury to the left index finger. She has been doing local wound care but it has continued to be weak, numb, swollen, and stiff.  Discussed the reason for surgical intervention due to the continuation of symptoms.  She is here today for surgery.  She denies chest pain, shortness of breath, fever, chills, nausea, vomiting, or diarrhea.    Past Medical History:  Diagnosis Date  . Abnormal liver function tests   . Abnormal thyroid function test   . Acanthosis   . Allergic rhinitis   . Asthma   . Combined hyperlipidemia   . Concussion   . Diabetes mellitus (HCC)   . Diabetes mellitus without complication (HCC)   . Dyspepsia   . Eczema   . Erythema nodosum   . Gastroesophageal reflux   . Goiter    hypothyroidsm--hashimoto's  . Hypertension   . Iron deficiency anemia   . Lactose intolerance   . Obesity   . Prediabetes   . Psoriatic arthritis [facility])     Past Surgical History:  Procedure Laterality Date  . ANKLE SURGERY Right    --bone spur  . KNEE SURGERY Left 2014   repair of ACL  . sinuses    . TONSILLECTOMY AND ADENOIDECTOMY    . TYMPANOSTOMY TUBE PLACEMENT    . WISDOM TOOTH EXTRACTION      Family History  Problem Relation Age of Onset  . Diabetes Mother   . Lupus Mother   . Hyperlipidemia Father   . Hypertension Father   . Diabetes Father   . Colon cancer Father   . Thyroid disease Brother   . Eczema Brother   . Hyperlipidemia Brother   . Diabetes Brother        pre diabetic  . Cholelithiasis Maternal Grandmother   . Diabetes Maternal Grandmother   . Cervical cancer Maternal Grandmother   . Stroke Maternal Grandmother   . Cancer Maternal Grandfather   . Diabetes Maternal Grandfather   . Stroke Maternal Grandfather   . Diabetes Paternal Grandmother   . Diabetes  Paternal Grandfather    Social History:  reports that she has never smoked. She has never used smokeless tobacco. She reports that she does not drink alcohol or use drugs.  Allergies:  Allergies  Allergen Reactions  . Corn-Containing Products Itching  . Peanut-Containing Drug Products Itching  . Soy Allergy Itching  . Amoxicillin Nausea And Vomiting and Rash  . Other Itching and Other (See Comments)    Also, tree nuts, cats, and dogs (pet dander) - per allergy test    No medications prior to admission.    No results found for this or any previous visit (from the past 48 hour(s)). No results found.  ROS NO RECENT ILLNESSES OR HOSPITALIZATIONS   There were no vitals taken for this visit. Physical Exam  General Appearance:  Alert, cooperative, no distress, appears stated age  Head:  Normocephalic, without obvious abnormality, atraumatic  Eyes:  Pupils equal, conjunctiva/corneas clear,         Throat: Lips, mucosa, and tongue normal; teeth and gums normal  Neck: No visible masses     Lungs:   respirations unlabored  Chest Wall:  No tenderness or deformity  Heart:  Regular rate and rhythm,  Abdomen:  Soft, non-tender,         Extremities:  On examination of the left index finger the patient does have the nail plate deformity.  The patient does have the bulbous granuloma tissue directly over the ulnar aspect of the index finger.  This is consistent with a pyogenic granuloma.  Patient has good DIP and PIP flexion No wounds to the long ring and small  Pulses: 2+ and symmetric  Skin: Skin color, texture, turgor normal, no rashes or lesions     Neurologic: Normal     Assessment LEFT INDEX FINGER OPEN DISTAL PHALANX FRACTURE  Plan LEFT INDEX FINGER OPEN DEBRIDEMENT AND REPAIR AS INDICATED   WE ARE PLANNING SURGERY FOR YOUR UPPER EXTREMITY. THE RISKS AND BENEFITS OF SURGERY INCLUDE BUT NOT LIMITED TO BLEEDING INFECTION, DAMAGE TO NEARBY NERVES ARTERIES TENDONS, FAILURE OF  SURGERY TO ACCOMPLISH ITS INTENDED GOALS, PERSISTENT SYMPTOMS AND NEED FOR FURTHER SURGICAL INTERVENTION. WITH THIS IN MIND WE WILL PROCEED. I HAVE DISCUSSED WITH THE PATIENT THE PRE AND POSTOPERATIVE REGIMEN AND THE DOS AND DON'TS. PT VOICED UNDERSTANDING AND INFORMED CONSENT SIGNED.  R/B/A DISCUSSED WITH PT IN OFFICE.  PT VOICED UNDERSTANDING OF PLAN CONSENT SIGNED DAY OF SURGERY PT SEEN AND EXAMINED PRIOR TO OPERATIVE PROCEDURE/DAY OF SURGERY SITE MARKED. QUESTIONS ANSWERED WILL GO HOME FOLLOWING SURGERY  Genifer Lazenby [facility] MD 04/20/2018    Karma Greaser 04/19/2018, 11:15 AM

## 2018-04-20 ENCOUNTER — Ambulatory Visit (HOSPITAL_BASED_OUTPATIENT_CLINIC_OR_DEPARTMENT_OTHER): Payer: 59 | Admitting: Certified Registered Nurse Anesthetist

## 2018-04-20 ENCOUNTER — Ambulatory Visit (HOSPITAL_BASED_OUTPATIENT_CLINIC_OR_DEPARTMENT_OTHER)
Admission: RE | Admit: 2018-04-20 | Discharge: 2018-04-20 | Disposition: A | Discharge status: Home / Self Care | Payer: 59 | Attending: Orthopedic Surgery | Admitting: Orthopedic Surgery

## 2018-04-20 ENCOUNTER — Encounter (HOSPITAL_BASED_OUTPATIENT_CLINIC_OR_DEPARTMENT_OTHER): Payer: Self-pay | Admitting: *Deleted

## 2018-04-20 ENCOUNTER — Encounter (HOSPITAL_BASED_OUTPATIENT_CLINIC_OR_DEPARTMENT_OTHER)
Admission: RE | Disposition: A | Discharge status: Home / Self Care | Payer: Self-pay | Source: Home / Self Care | Attending: Orthopedic Surgery

## 2018-04-20 DIAGNOSIS — E063 Autoimmune thyroiditis: Secondary | ICD-10-CM | POA: Diagnosis not present

## 2018-04-20 DIAGNOSIS — Z79890 Hormone replacement therapy (postmenopausal): Secondary | ICD-10-CM | POA: Insufficient documentation

## 2018-04-20 DIAGNOSIS — I10 Essential (primary) hypertension: Secondary | ICD-10-CM | POA: Insufficient documentation

## 2018-04-20 DIAGNOSIS — E119 Type 2 diabetes mellitus without complications: Secondary | ICD-10-CM | POA: Insufficient documentation

## 2018-04-20 DIAGNOSIS — Z79899 Other long term (current) drug therapy: Secondary | ICD-10-CM | POA: Diagnosis not present

## 2018-04-20 DIAGNOSIS — L4050 Arthropathic psoriasis, unspecified: Secondary | ICD-10-CM | POA: Insufficient documentation

## 2018-04-20 DIAGNOSIS — W228XXA Striking against or struck by other objects, initial encounter: Secondary | ICD-10-CM | POA: Diagnosis not present

## 2018-04-20 DIAGNOSIS — E669 Obesity, unspecified: Secondary | ICD-10-CM | POA: Insufficient documentation

## 2018-04-20 DIAGNOSIS — J45909 Unspecified asthma, uncomplicated: Secondary | ICD-10-CM | POA: Diagnosis not present

## 2018-04-20 DIAGNOSIS — Z7984 Long term (current) use of oral hypoglycemic drugs: Secondary | ICD-10-CM | POA: Diagnosis not present

## 2018-04-20 DIAGNOSIS — L980 Pyogenic granuloma: Secondary | ICD-10-CM | POA: Diagnosis not present

## 2018-04-20 DIAGNOSIS — K219 Gastro-esophageal reflux disease without esophagitis: Secondary | ICD-10-CM | POA: Diagnosis not present

## 2018-04-20 DIAGNOSIS — S62631B Displaced fracture of distal phalanx of left index finger, initial encounter for open fracture: Secondary | ICD-10-CM | POA: Diagnosis not present

## 2018-04-20 DIAGNOSIS — S67191A Crushing injury of left index finger, initial encounter: Secondary | ICD-10-CM | POA: Diagnosis present

## 2018-04-20 DIAGNOSIS — Z6836 Body mass index (BMI) 36.0-36.9, adult: Secondary | ICD-10-CM | POA: Diagnosis not present

## 2018-04-20 HISTORY — PX: OPEN REDUCTION INTERNAL FIXATION (ORIF) DISTAL PHALANX: SHX6236

## 2018-04-20 LAB — GLUCOSE, CAPILLARY
Glucose-Capillary: 85 mg/dL (ref 70–99)
Glucose-Capillary: 70 mg/dL (ref 70–99)

## 2018-04-20 SURGERY — OPEN REDUCTION INTERNAL FIXATION (ORIF) DISTAL PHALANX
Anesthesia: Monitor Anesthesia Care | Site: Index Finger | Laterality: Left

## 2018-04-20 MED ORDER — HYDROMORPHONE HCL 1 MG/ML IJ SOLN: 0.2500 mg | INTRAMUSCULAR | Status: DC | PRN

## 2018-04-20 MED ORDER — MIDAZOLAM HCL 2 MG/2ML IJ SOLN
INTRAMUSCULAR | Status: AC
  Filled 2018-04-20: qty 2

## 2018-04-20 MED ORDER — VANCOMYCIN HCL IN DEXTROSE 1-5 GM/200ML-% IV SOLN
INTRAVENOUS | Status: AC
  Filled 2018-04-20: qty 200

## 2018-04-20 MED ORDER — OXYCODONE HCL 5 MG/5ML PO SOLN: 5.0000 mg | Freq: Once | ORAL | Status: DC | PRN

## 2018-04-20 MED ORDER — LIDOCAINE HCL 1 % IJ SOLN
INTRAMUSCULAR | Status: DC | PRN
  Administered 2018-04-20: 9 mL via INTRAMUSCULAR

## 2018-04-20 MED ORDER — LIDOCAINE HCL (CARDIAC) PF 100 MG/5ML IV SOSY
PREFILLED_SYRINGE | INTRAVENOUS | Status: DC | PRN
  Administered 2018-04-20: 60 mg via INTRAVENOUS

## 2018-04-20 MED ORDER — LACTATED RINGERS IV SOLN
INTRAVENOUS | Status: DC
  Administered 2018-04-20 (×2): 10 mL/h via INTRAVENOUS

## 2018-04-20 MED ORDER — OXYCODONE HCL 5 MG PO TABS: 5.0000 mg | ORAL_TABLET | Freq: Once | ORAL | Status: DC | PRN

## 2018-04-20 MED ORDER — BUPIVACAINE HCL (PF) 0.25 % IJ SOLN
INTRAMUSCULAR | Status: AC
  Filled 2018-04-20: qty 30

## 2018-04-20 MED ORDER — PROPOFOL 500 MG/50ML IV EMUL
INTRAVENOUS | Status: DC | PRN
  Administered 2018-04-20: 100 ug/kg/min via INTRAVENOUS

## 2018-04-20 MED ORDER — FENTANYL CITRATE (PF) 100 MCG/2ML IJ SOLN
50.0000 ug | INTRAMUSCULAR | Status: DC | PRN
  Administered 2018-04-20: 100 ug via INTRAVENOUS

## 2018-04-20 MED ORDER — SCOPOLAMINE 1 MG/3DAYS TD PT72: 1.0000 | MEDICATED_PATCH | Freq: Once | TRANSDERMAL | Status: DC | PRN

## 2018-04-20 MED ORDER — MIDAZOLAM HCL 2 MG/2ML IJ SOLN
1.0000 mg | INTRAMUSCULAR | Status: DC | PRN
  Administered 2018-04-20: 2 mg via INTRAVENOUS

## 2018-04-20 MED ORDER — VANCOMYCIN HCL 1000 MG IV SOLR
1000.0000 mg | Freq: Once | INTRAVENOUS | Status: AC
  Administered 2018-04-20: 1000 mg via INTRAVENOUS

## 2018-04-20 MED ORDER — CHLORHEXIDINE GLUCONATE 4 % EX LIQD: 60.0000 mL | Freq: Once | CUTANEOUS | Status: DC

## 2018-04-20 MED ORDER — LIDOCAINE HCL (PF) 1 % IJ SOLN
INTRAMUSCULAR | Status: AC
  Filled 2018-04-20: qty 30

## 2018-04-20 MED ORDER — PROMETHAZINE HCL 25 MG/ML IJ SOLN: 6.2500 mg | INTRAMUSCULAR | Status: DC | PRN

## 2018-04-20 MED ORDER — FENTANYL CITRATE (PF) 100 MCG/2ML IJ SOLN
INTRAMUSCULAR | Status: AC
  Filled 2018-04-20: qty 2

## 2018-04-20 MED ORDER — VANCOMYCIN HCL 1000 MG IV SOLR
1000.0000 mg | INTRAVENOUS | Status: DC
  Administered 2018-04-20: 1000 mg via INTRAVENOUS

## 2018-04-20 MED ORDER — PROPOFOL 10 MG/ML IV BOLUS
INTRAVENOUS | Status: AC
  Filled 2018-04-20: qty 20

## 2018-04-20 SURGICAL SUPPLY — 53 items
BANDAGE ACE 3X5.8 VEL STRL LF (GAUZE/BANDAGES/DRESSINGS) IMPLANT
BENZOIN TINCTURE PRP APPL 2/3 (GAUZE/BANDAGES/DRESSINGS) IMPLANT
BLADE SURG 15 STRL LF DISP TIS (BLADE) ×1 IMPLANT
BLADE SURG 15 STRL SS (BLADE) ×1
BNDG COHESIVE 1X5 TAN NS LF (GAUZE/BANDAGES/DRESSINGS) ×2 IMPLANT
BNDG ELASTIC 2X5.8 VLCR STR LF (GAUZE/BANDAGES/DRESSINGS) IMPLANT
BNDG ESMARK 4X9 LF (GAUZE/BANDAGES/DRESSINGS) ×2 IMPLANT
CANISTER SUCT 1200ML W/VALVE (MISCELLANEOUS) IMPLANT
CORD BIPOLAR FORCEPS 12FT (ELECTRODE) ×2 IMPLANT
COVER BACK TABLE 60X90IN (DRAPES) ×2 IMPLANT
COVER WAND RF STERILE (DRAPES) IMPLANT
CUFF TOURN SGL QUICK 18X4 (TOURNIQUET CUFF) ×2 IMPLANT
DECANTER SPIKE VIAL GLASS SM (MISCELLANEOUS) IMPLANT
DRAPE EXTREMITY T 121X128X90 (DISPOSABLE) ×2 IMPLANT
DRAPE OEC MINIVIEW 54X84 (DRAPES) ×2 IMPLANT
DRAPE SURG 17X23 STRL (DRAPES) ×2 IMPLANT
DRSG EMULSION OIL 3X3 NADH (GAUZE/BANDAGES/DRESSINGS) ×2 IMPLANT
GAUZE 4X4 16PLY RFD (DISPOSABLE) IMPLANT
GAUZE XEROFORM 1X8 LF (GAUZE/BANDAGES/DRESSINGS) IMPLANT
GLOVE BIO SURGEON STRL SZ 6.5 (GLOVE) ×4 IMPLANT
GLOVE BIO SURGEON STRL SZ8 (GLOVE) ×2 IMPLANT
GLOVE BIOGEL PI IND STRL 7.0 (GLOVE) ×2 IMPLANT
GLOVE BIOGEL PI IND STRL 8.5 (GLOVE) ×1 IMPLANT
GLOVE BIOGEL PI INDICATOR 7.0 (GLOVE) ×2
GLOVE BIOGEL PI INDICATOR 8.5 (GLOVE) ×1
GOWN STRL REUS W/ TWL LRG LVL3 (GOWN DISPOSABLE) ×1 IMPLANT
GOWN STRL REUS W/TWL LRG LVL3 (GOWN DISPOSABLE) ×1
NEEDLE HYPO 25X1 1.5 SAFETY (NEEDLE) ×2 IMPLANT
NS IRRIG 1000ML POUR BTL (IV SOLUTION) IMPLANT
PACK BASIN DAY SURGERY FS (CUSTOM PROCEDURE TRAY) ×2 IMPLANT
PAD CAST 3X4 CTTN HI CHSV (CAST SUPPLIES) IMPLANT
PADDING CAST ABS 3INX4YD NS (CAST SUPPLIES)
PADDING CAST ABS COTTON 3X4 (CAST SUPPLIES) IMPLANT
PADDING CAST COTTON 3X4 STRL (CAST SUPPLIES)
PADDING UNDERCAST 2 STRL (CAST SUPPLIES)
PADDING UNDERCAST 2X4 STRL (CAST SUPPLIES) IMPLANT
SHEET MEDIUM DRAPE 40X70 STRL (DRAPES) IMPLANT
SPLINT FIBERGLASS 3X35 (CAST SUPPLIES) IMPLANT
STOCKINETTE 4X48 STRL (DRAPES) ×2 IMPLANT
STRIP CLOSURE SKIN 1/2X4 (GAUZE/BANDAGES/DRESSINGS) IMPLANT
SUCTION FRAZIER HANDLE 10FR (MISCELLANEOUS)
SUCTION TUBE FRAZIER 10FR DISP (MISCELLANEOUS) IMPLANT
SUT CHROMIC 5 0 P 3 (SUTURE) ×2 IMPLANT
SUT MNCRL AB 3-0 PS2 18 (SUTURE) IMPLANT
SUT MON AB 4-0 PC3 18 (SUTURE) IMPLANT
SUT PROLENE 4 0 PS 2 18 (SUTURE) IMPLANT
SUT VIC AB 3-0 FS2 27 (SUTURE) IMPLANT
SYR BULB 3OZ (MISCELLANEOUS) ×2 IMPLANT
SYR CONTROL 10ML LL (SYRINGE) ×2 IMPLANT
TOWEL GREEN STERILE FF (TOWEL DISPOSABLE) ×4 IMPLANT
TRAY DSU PREP LF (CUSTOM PROCEDURE TRAY) ×2 IMPLANT
TUBE CONNECTING 20X1/4 (TUBING) IMPLANT
UNDERPAD 30X30 (UNDERPADS AND DIAPERS) ×2 IMPLANT

## 2018-04-20 NOTE — Transfer of Care (Signed)
Immediate Anesthesia Transfer of Care Note  Patient: Valerie Marquez  Procedure(s) Performed: OPEN DEBRIDEMENT AND REPAIR OF DISTAL PHALANX (Left Index Finger)  Patient Location: PACU  Anesthesia Type:MAC  Level of Consciousness: awake, alert , oriented and patient cooperative  Airway & Oxygen Therapy: Patient Spontanous Breathing and Patient connected to face mask oxygen  Post-op Assessment: Report given to RN and Post -op Vital signs reviewed and stable  Post vital signs: Reviewed and stable  Last Vitals:  Vitals Value Taken Time  BP    Temp    Pulse 81 04/20/2018  3:14 PM  Resp 24 04/20/2018  3:14 PM  SpO2 100 % 04/20/2018  3:14 PM  Vitals shown include unvalidated device data.  Last Pain:  Vitals:   04/20/18 1354  TempSrc: Oral      Patients Stated Pain Goal: 2 (38/75/64 [id number])  Complications: No apparent anesthesia complications

## 2018-04-20 NOTE — Op Note (Signed)
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 Bradly Bienenstock who is scrubbed and present for the entire procedure  ASSISTANT SURGEON: None  ANESTHESIA: 1% Xylocaine quarter percent Marcaine local block with IV sedation  OPERATIVE PROCEDURE: #1: Open treatment of left index finger distal phalanx fracture not requiring internal fixation #2: Open debridement of left index finger distal phalanx fracture debridement of skin subcutaneous tissue and bone #3: Left index finger removal of less than 1.5 cm vascular malformation  IMPLANTS: None  RADIOGRAPHIC INTERPRETATION: None  SURGICAL INDICATIONS: Patient is a right-hand-dominant female sustained a crushing injury to the left index finger.  Patient was seen evaluate the office and recommended undergo the above procedure.  Risks of surgery include but not limited to bleeding infection damage nearby nerves arteries or tendons loss of motion of the wrist and digits incomplete relief of symptoms and need for further surgical intervention.  SURGICAL TECHNIQUE: Patient was palpated and fine in the preoperative holding area marked with a permanent marker made on the left index finger to indicate the correct operative site.  Patient then brought back the operating placed supine on the anesthesia table where the IV sedation was administered.  Well-padded tourniquet was then placed on the left brachium.  The local anesthetic was administered.  The left upper extremity was then prepped and draped in the normal sterile fashion.  A timeout was called the correct site was identified the procedure then begun.  Attention then turned to the left index finger the vascular malformation was then circumferentially dissected and then removed less than 1.5 cm vascular malformation.  Dissection carried all the way down to the nailbed down  to the distal phalanx fracture along the ulnar margin of the distal phalanx where the fracture occurred.  The debridement was then carried out all the way down to the bone.  This was excisional debridement down with sharp scissors and knife.  The wound was then thoroughly irrigated.  This after treatment of the distal phalanx fracture without internal fixation the nailbed was then repaired to the underlying skin overlying skin with simple chromic suture.  Adaptic dressing a sterile compressive bandage then applied.  The patient tolerated the procedure well.  The nail plate had been trimmed to allow for repair of the nailbed.  Tourniquet deflated there is good perfusion of the fingertip.  Patient was then taken recovery in good condition.  POSTOPERATIVE PLAN: Patient be seen back in 1 week for wound check small finger protector splint gradual use and activity as the wound heals.

## 2018-04-20 NOTE — Anesthesia Postprocedure Evaluation (Signed)
Anesthesia Post Note  Patient: Valerie Marquez  Procedure(s) Performed: OPEN DEBRIDEMENT AND REPAIR OF DISTAL PHALANX (Left Index Finger)     Patient location during evaluation: PACU Anesthesia Type: MAC Level of consciousness: awake and alert Pain management: pain level controlled Vital Signs Assessment: post-procedure vital signs reviewed and stable Respiratory status: spontaneous breathing, nonlabored ventilation and respiratory function stable Cardiovascular status: stable and blood pressure returned to baseline Postop Assessment: no apparent nausea or vomiting Anesthetic complications: no    Last Vitals:  Vitals:   04/20/18 1354  BP: 138/84  Pulse: 80  Resp: 16  Temp: 36.8 C  SpO2: 99%    Last Pain:  Vitals:   04/20/18 1354  TempSrc: Oral                 Lynda Rainwater

## 2018-04-20 NOTE — Progress Notes (Signed)
Vancomycin carrier fluid 5% dextrose, discussed with Dr. Hyacinth Meeker due to pts diabetes, okay to give antibiotic as packaged, will recheck CBG in Pacu.

## 2018-04-20 NOTE — Anesthesia Preprocedure Evaluation (Signed)
Anesthesia Evaluation  Patient identified by MRN, date of birth, ID band Patient awake    Reviewed: Allergy & Precautions, NPO status , Patient's Chart, lab work & pertinent test results  Airway Mallampati: II  TM Distance: >3 FB Neck ROM: Full    Dental no notable dental hx.    Pulmonary asthma ,    Pulmonary exam normal breath sounds clear to auscultation       Cardiovascular hypertension, Pt. on medications negative cardio ROS Normal cardiovascular exam Rhythm:Regular Rate:Normal     Neuro/Psych negative neurological ROS  negative psych ROS   GI/Hepatic Neg liver ROS, GERD  ,  Endo/Other  diabetes, Type 2Hypothyroidism   Renal/GU negative Renal ROS  negative genitourinary   Musculoskeletal negative musculoskeletal ROS (+)   Abdominal (+) + obese,   Peds negative pediatric ROS (+)  Hematology negative hematology ROS (+)   Anesthesia Other Findings   Reproductive/Obstetrics negative OB ROS                             Anesthesia Physical Anesthesia Plan  ASA: III  Anesthesia Plan: MAC   Post-op Pain Management:    Induction: Intravenous  PONV Risk Score and Plan: 2 and Ondansetron, Midazolam and Treatment may vary due to age or medical condition  Airway Management Planned: Simple Face Mask  Additional Equipment:   Intra-op Plan:   Post-operative Plan:   Informed Consent: I have reviewed the patients History and Physical, chart, labs and discussed the procedure including the risks, benefits and alternatives for the proposed anesthesia with the patient or authorized representative who has indicated his/her understanding and acceptance.     Dental advisory given  Plan Discussed with: CRNA  Anesthesia Plan Comments:         Anesthesia Quick Evaluation

## 2018-04-20 NOTE — Anesthesia Procedure Notes (Signed)
Procedure Name: MAC Date/Time: 04/20/2018 2:51 PM Performed by: Signe Colt, CRNA Pre-anesthesia Checklist: Patient identified, Emergency Drugs available, Suction available, Patient being monitored and Timeout performed Patient Re-evaluated:Patient Re-evaluated prior to induction Oxygen Delivery Method: Simple face mask

## 2018-04-20 NOTE — Discharge Instructions (Signed)
KEEP BANDAGE CLEAN AND DRY °CALL OFFICE FOR F/U APPT [phone] in 8 days °KEEP HAND ELEVATED ABOVE HEART °OK TO APPLY ICE TO OPERATIVE AREA °CONTACT OFFICE IF ANY WORSENING PAIN OR CONCERNS. ° ° °Post Anesthesia Home Care Instructions ° °Activity: °Get plenty of rest for the remainder of the day. A responsible individual must stay with you for 24 hours following the procedure.  °For the next 24 hours, DO NOT: °-Drive a car °-Operate machinery °-Drink alcoholic beverages °-Take any medication unless instructed by your physician °-Make any legal decisions or sign important papers. ° °Meals: °Start with liquid foods such as gelatin or soup. Progress to regular foods as tolerated. Avoid greasy, spicy, heavy foods. If nausea and/or vomiting occur, drink only clear liquids until the nausea and/or vomiting subsides. Call your physician if vomiting continues. ° °Special Instructions/Symptoms: °Your throat may feel dry or sore from the anesthesia or the breathing tube placed in your throat during surgery. If this causes discomfort, gargle with warm salt water. The discomfort should disappear within 24 hours. ° °If you had a scopolamine patch placed behind your ear for the management of post- operative nausea and/or vomiting: ° °1. The medication in the patch is effective for 72 hours, after which it should be removed.  Wrap patch in a tissue and discard in the trash. Wash hands thoroughly with soap and water. °2. You may remove the patch earlier than 72 hours if you experience unpleasant side effects which may include dry mouth, dizziness or visual disturbances. °3. Avoid touching the patch. Wash your hands with soap and water after contact with the patch. °   ° °

## 2018-04-21 ENCOUNTER — Encounter (HOSPITAL_BASED_OUTPATIENT_CLINIC_OR_DEPARTMENT_OTHER): Payer: Self-pay | Admitting: Orthopedic Surgery

## 2018-06-28 ENCOUNTER — Other Ambulatory Visit: Payer: Self-pay | Admitting: Allergy and Immunology

## 2018-07-27 ENCOUNTER — Other Ambulatory Visit: Payer: Self-pay | Admitting: Allergy and Immunology

## 2018-08-01 ENCOUNTER — Other Ambulatory Visit: Payer: Self-pay | Admitting: *Deleted

## 2018-08-01 MED ORDER — BREO ELLIPTA 200-25 MCG/INH IN AEPB: 1.0000 | INHALATION_SPRAY | Freq: Every day | RESPIRATORY_TRACT | 0 refills | Status: AC

## 2018-10-01 ENCOUNTER — Encounter

## 2018-12-21 ENCOUNTER — Emergency Department (HOSPITAL_COMMUNITY)
Admission: EM | Admit: 2018-12-21 | Discharge: 2018-12-21 | Disposition: A | Discharge status: Home / Self Care | Payer: 59 | Attending: Emergency Medicine | Admitting: Emergency Medicine

## 2018-12-21 ENCOUNTER — Telehealth (HOSPITAL_COMMUNITY): Payer: Self-pay | Admitting: Physician Assistant

## 2018-12-21 ENCOUNTER — Other Ambulatory Visit: Payer: Self-pay

## 2018-12-21 ENCOUNTER — Encounter (HOSPITAL_COMMUNITY): Payer: Self-pay | Admitting: Emergency Medicine

## 2018-12-21 DIAGNOSIS — Z79899 Other long term (current) drug therapy: Secondary | ICD-10-CM | POA: Insufficient documentation

## 2018-12-21 DIAGNOSIS — E119 Type 2 diabetes mellitus without complications: Secondary | ICD-10-CM | POA: Diagnosis not present

## 2018-12-21 DIAGNOSIS — J45909 Unspecified asthma, uncomplicated: Secondary | ICD-10-CM | POA: Diagnosis not present

## 2018-12-21 DIAGNOSIS — M5442 Lumbago with sciatica, left side: Secondary | ICD-10-CM | POA: Diagnosis not present

## 2018-12-21 DIAGNOSIS — Z7984 Long term (current) use of oral hypoglycemic drugs: Secondary | ICD-10-CM | POA: Insufficient documentation

## 2018-12-21 DIAGNOSIS — I10 Essential (primary) hypertension: Secondary | ICD-10-CM | POA: Insufficient documentation

## 2018-12-21 DIAGNOSIS — Z91010 Allergy to peanuts: Secondary | ICD-10-CM | POA: Insufficient documentation

## 2018-12-21 DIAGNOSIS — M545 Low back pain: Secondary | ICD-10-CM | POA: Diagnosis present

## 2018-12-21 LAB — I-STAT BETA HCG BLOOD, ED (MC, WL, AP ONLY): I-stat hCG, quantitative: <5 m[IU]/mL (ref <5)

## 2018-12-21 MED ORDER — LIDOCAINE 5 % EX PTCH: 1.0000 | MEDICATED_PATCH | CUTANEOUS | 0 refills | Status: DC

## 2018-12-21 MED ORDER — PREDNISONE 20 MG PO TABS: 60.0000 mg | ORAL_TABLET | Freq: Every day | ORAL | 0 refills | Status: DC

## 2018-12-21 MED ORDER — HYDROCODONE-ACETAMINOPHEN 5-325 MG PO TABS: 1.0000 | ORAL_TABLET | ORAL | 0 refills | Status: DC | PRN

## 2018-12-21 MED ORDER — PREDNISONE 20 MG PO TABS
60.0000 mg | ORAL_TABLET | Freq: Once | ORAL | Status: AC
  Administered 2018-12-21: 60 mg via ORAL
  Filled 2018-12-21: qty 3

## 2018-12-21 MED ORDER — HYDROMORPHONE HCL 1 MG/ML IJ SOLN
1.0000 mg | Freq: Once | INTRAMUSCULAR | Status: AC
  Administered 2018-12-21: 1 mg via INTRAMUSCULAR
  Filled 2018-12-21: qty 1

## 2018-12-21 MED ORDER — KETOROLAC TROMETHAMINE 30 MG/ML IJ SOLN
30.0000 mg | Freq: Once | INTRAMUSCULAR | Status: AC
  Administered 2018-12-21: 30 mg via INTRAMUSCULAR
  Filled 2018-12-21: qty 1

## 2018-12-21 MED ORDER — PREDNISONE 10 MG PO TABS: 20.0000 mg | ORAL_TABLET | Freq: Every day | ORAL | 0 refills | Status: AC

## 2018-12-21 MED ORDER — HYDROCODONE-ACETAMINOPHEN 5-325 MG PO TABS: 2.0000 | ORAL_TABLET | ORAL | 0 refills | Status: AC | PRN

## 2018-12-21 MED ORDER — ONDANSETRON 4 MG PO TBDP
4.0000 mg | ORAL_TABLET | Freq: Once | ORAL | Status: AC
  Administered 2018-12-21: 4 mg via ORAL
  Filled 2018-12-21: qty 1

## 2018-12-21 MED ORDER — PREDNISONE 20 MG PO TABS: 60.0000 mg | ORAL_TABLET | Freq: Every day | ORAL | 0 refills | Status: AC

## 2018-12-21 MED ORDER — MORPHINE SULFATE (PF) 4 MG/ML IV SOLN
4.0000 mg | Freq: Once | INTRAVENOUS | Status: AC
  Administered 2018-12-21: 4 mg via INTRAMUSCULAR
  Filled 2018-12-21: qty 1

## 2018-12-21 MED ORDER — LIDOCAINE 5 % EX PTCH: 1.0000 | MEDICATED_PATCH | CUTANEOUS | 0 refills | Status: AC

## 2018-12-21 MED ORDER — HYDROCODONE-ACETAMINOPHEN 5-325 MG PO TABS: 2.0000 | ORAL_TABLET | ORAL | 0 refills | Status: DC | PRN

## 2018-12-21 MED ORDER — HYDROMORPHONE HCL 1 MG/ML IJ SOLN: 1.0000 mg | Freq: Once | INTRAMUSCULAR | Status: DC

## 2018-12-21 MED ORDER — LIDOCAINE 5 % EX PTCH
1.0000 | MEDICATED_PATCH | CUTANEOUS | Status: DC
  Administered 2018-12-21: 1 via TRANSDERMAL
  Filled 2018-12-21: qty 1

## 2018-12-21 MED ORDER — TIZANIDINE HCL 2 MG PO CAPS: 2.0000 mg | ORAL_CAPSULE | Freq: Three times a day (TID) | ORAL | 0 refills | Status: AC

## 2018-12-21 NOTE — Telephone Encounter (Signed)
Pharmacy not receiving script. Old orders dc'd. New script sent

## 2018-12-21 NOTE — ED Triage Notes (Addendum)
Last Tuesday patient was moving boxes and was leaning down and felt a pinch in her lower R back, that spread down to her L side. It has progressively gotten worse, patient able to walk with 10/10 pain. Went to [facility] and they scheduled an MRI but they said it would be 3-5 days.

## 2018-12-21 NOTE — Telephone Encounter (Signed)
Patient is still not receiving Prescriptions to [facility] after speaking with Pharmacist. Prior controlled substance scripts cancelled after speaking with pharmacist. Will verbal order non narcotic prescriptions and will send Norco to [facility] per pharmacist recommendation.

## 2018-12-21 NOTE — Discharge Instructions (Addendum)
Takes the medications as prescribed. Follow up with Orthopedics. Return for new or worsening symptoms.  Make sure to monitor your blood sugars at home.  If they become over 250 you need to stop taking the prednisone.  Medications you are taking may make you sleepy or drowsy.  Do not take more than prescribed.  Do not drive or operate heavy machinery or make life or death decisions while taking these medications.  These medications also may become addictive.  Please only take for severe pain it is important you follow-up with orthopedics for your MRI.
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
## 2018-12-21 NOTE — Telephone Encounter (Signed)
Patient evaluated by myself earlier in the ED. Pharmacy did not receive discharge prescriptions. Will reorder.

## 2019-06-12 IMAGING — DX DG CHEST 2V
2 series · 2 of 2 positions shown · non-contrast
Comparison: Chest radiograph dated 11/13/2014

CLINICAL DATA: 20-year-old female with cough and cold symptoms.

EXAM:
CHEST  2 VIEW

[w chest pa]
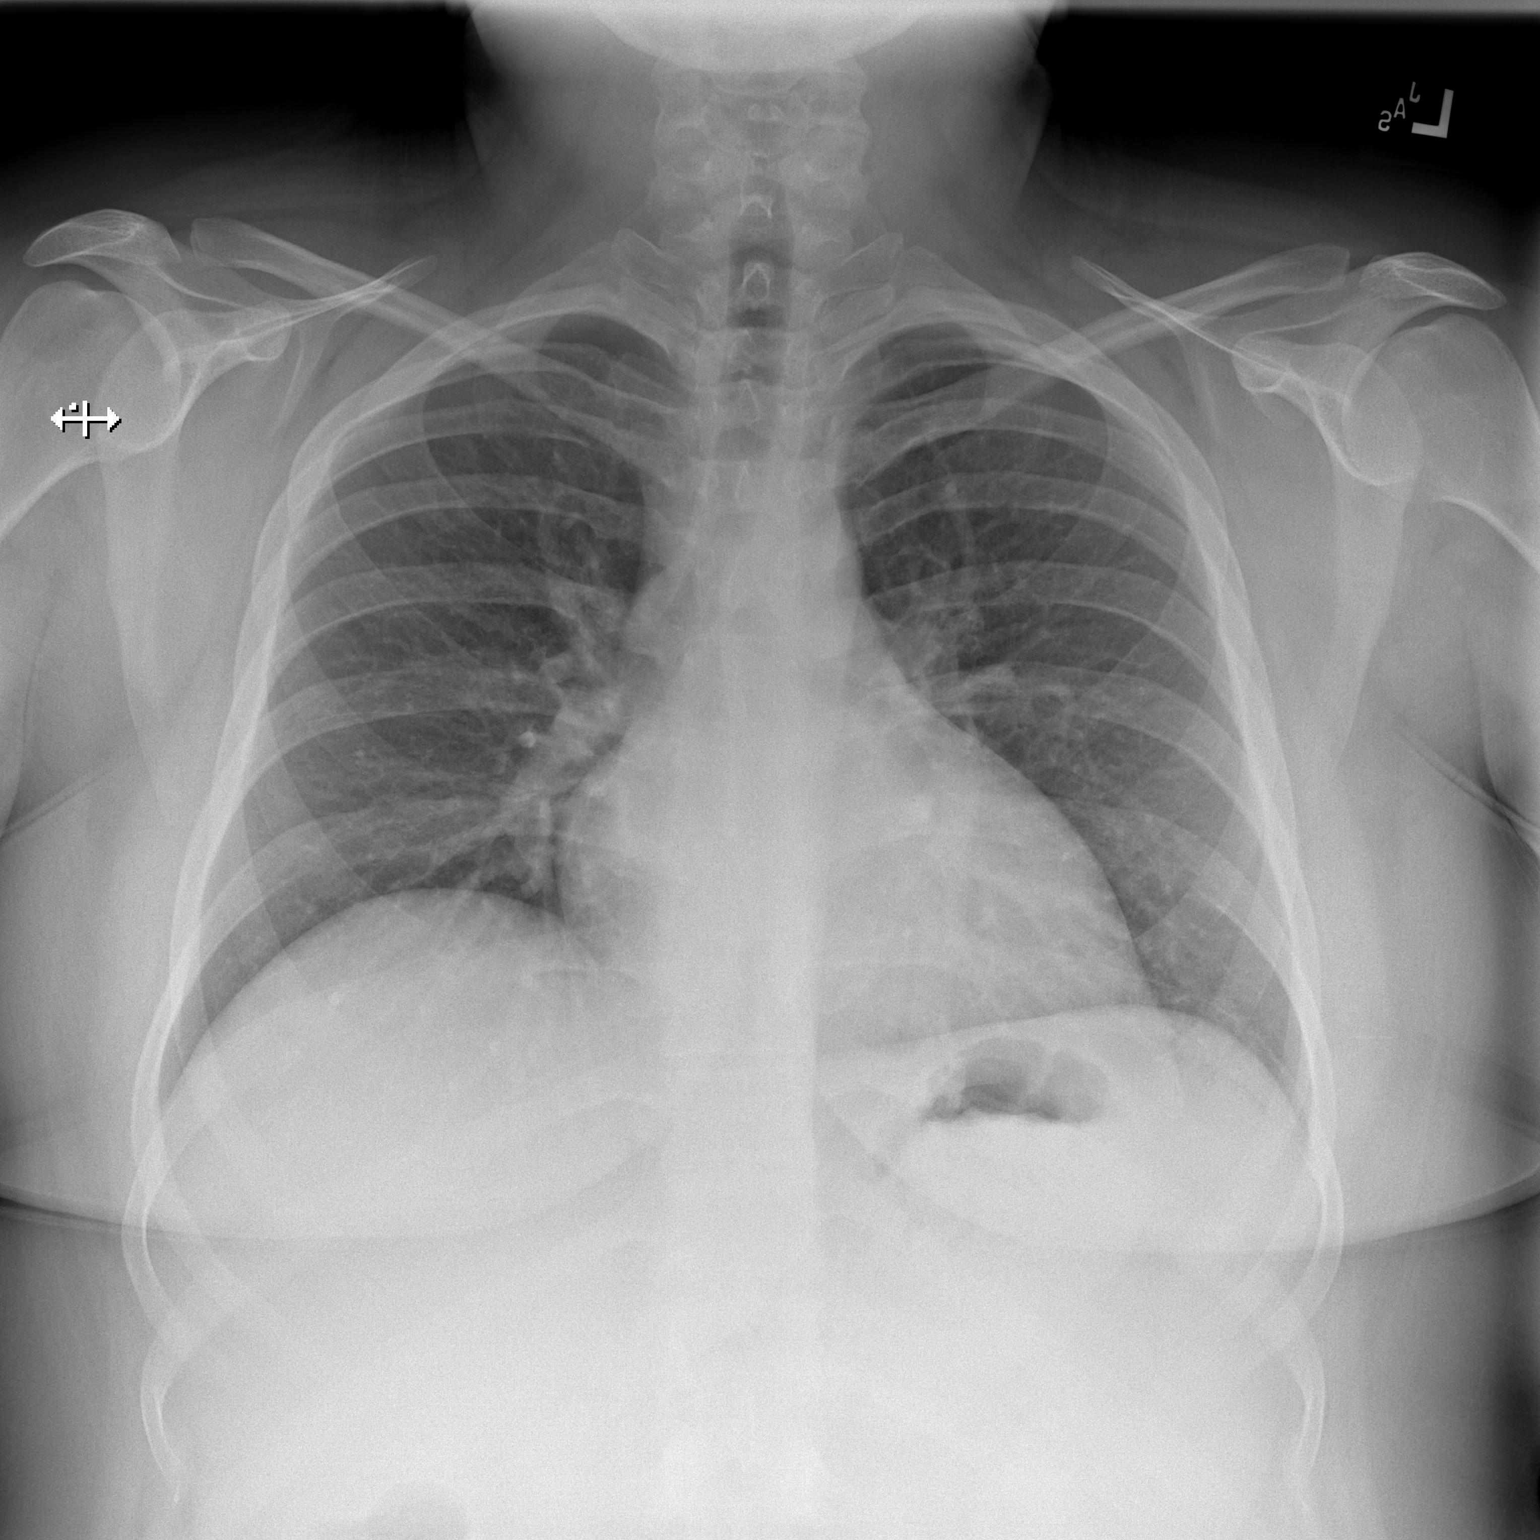

[w chest lat]
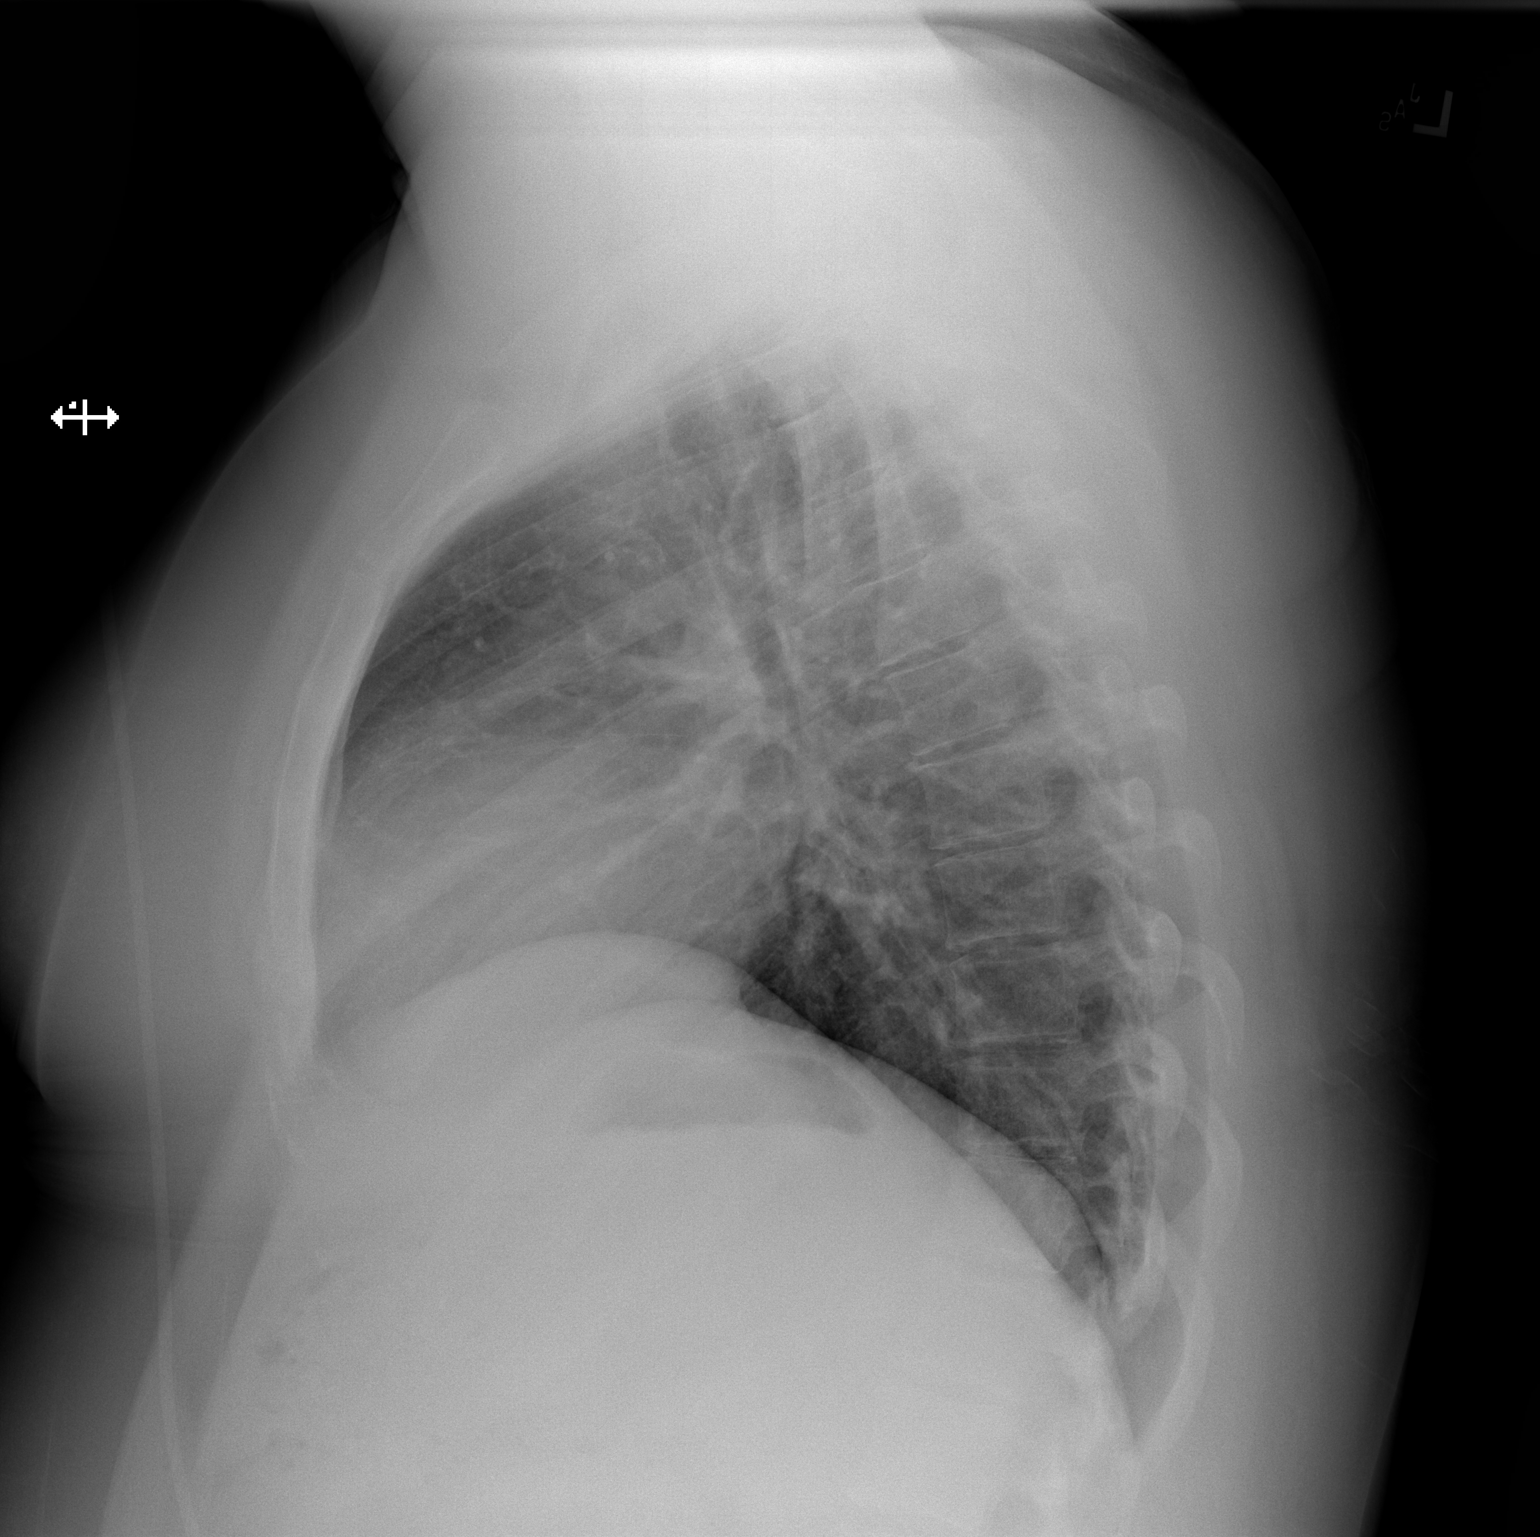

[2 of 2 positions shown; findings below may reference images not displayed]

FINDINGS: The heart size and mediastinal contours are within normal limits.
Both lungs are clear. The visualized skeletal structures are
unremarkable.
IMPRESSION: No active cardiopulmonary disease.
